# Patient Record
Sex: Female | Born: 1955 | Race: Black or African American | Hispanic: No | Marital: Single | Smoking: Current some day smoker
Health system: Southern US, Community
[De-identification: ages and names within clinical notes are randomized; demographics above are authoritative.]

## PROBLEM LIST (undated history)

## (undated) DIAGNOSIS — F319 Bipolar disorder, unspecified: Secondary | ICD-10-CM

## (undated) DIAGNOSIS — Z720 Tobacco use: Secondary | ICD-10-CM

## (undated) DIAGNOSIS — I499 Cardiac arrhythmia, unspecified: Secondary | ICD-10-CM

## (undated) DIAGNOSIS — E669 Obesity, unspecified: Secondary | ICD-10-CM

## (undated) DIAGNOSIS — I4891 Unspecified atrial fibrillation: Secondary | ICD-10-CM

## (undated) DIAGNOSIS — Z7901 Long term (current) use of anticoagulants: Secondary | ICD-10-CM

## (undated) DIAGNOSIS — I10 Essential (primary) hypertension: Secondary | ICD-10-CM

## (undated) DIAGNOSIS — I340 Nonrheumatic mitral (valve) insufficiency: Secondary | ICD-10-CM

## (undated) DIAGNOSIS — I509 Heart failure, unspecified: Secondary | ICD-10-CM

## (undated) HISTORY — DX: Nonrheumatic mitral (valve) insufficiency: I34.0

## (undated) HISTORY — DX: Long term (current) use of anticoagulants: Z79.01

## (undated) HISTORY — DX: Essential (primary) hypertension: I10

## (undated) HISTORY — DX: Heart failure, unspecified: I50.9

## (undated) HISTORY — DX: Obesity, unspecified: E66.9

## (undated) HISTORY — DX: Tobacco use: Z72.0

## (undated) HISTORY — DX: Bipolar disorder, unspecified: F31.9

## (undated) HISTORY — DX: Unspecified atrial fibrillation: I48.91

---

## 2002-09-03 ENCOUNTER — Emergency Department (HOSPITAL_COMMUNITY): Admission: EM | Admit: 2002-09-03 | Discharge: 2002-09-03 | Payer: Self-pay | Admitting: Emergency Medicine

## 2002-09-05 ENCOUNTER — Inpatient Hospital Stay (HOSPITAL_COMMUNITY): Admission: EM | Admit: 2002-09-05 | Discharge: 2002-09-14 | Payer: Self-pay | Admitting: Psychiatry

## 2002-10-27 ENCOUNTER — Encounter: Admission: RE | Admit: 2002-10-27 | Discharge: 2002-10-27 | Payer: Self-pay | Admitting: Psychiatry

## 2002-11-11 ENCOUNTER — Encounter: Payer: Self-pay | Admitting: Obstetrics and Gynecology

## 2002-11-11 ENCOUNTER — Encounter: Admission: RE | Admit: 2002-11-11 | Discharge: 2002-11-11 | Payer: Self-pay | Admitting: Obstetrics and Gynecology

## 2002-11-25 ENCOUNTER — Encounter: Payer: Self-pay | Admitting: Obstetrics and Gynecology

## 2002-11-25 ENCOUNTER — Ambulatory Visit (HOSPITAL_COMMUNITY): Admission: RE | Admit: 2002-11-25 | Discharge: 2002-11-25 | Payer: Self-pay | Admitting: Obstetrics and Gynecology

## 2003-12-22 ENCOUNTER — Ambulatory Visit (HOSPITAL_COMMUNITY): Payer: Self-pay | Admitting: Psychiatry

## 2004-01-26 ENCOUNTER — Ambulatory Visit (HOSPITAL_COMMUNITY): Payer: Self-pay | Admitting: Psychiatry

## 2004-03-29 ENCOUNTER — Ambulatory Visit (HOSPITAL_COMMUNITY): Payer: Self-pay | Admitting: Psychiatry

## 2004-05-31 ENCOUNTER — Ambulatory Visit (HOSPITAL_COMMUNITY): Payer: Self-pay | Admitting: Psychiatry

## 2004-08-12 ENCOUNTER — Ambulatory Visit (HOSPITAL_COMMUNITY): Payer: Self-pay | Admitting: Psychiatry

## 2004-10-16 ENCOUNTER — Ambulatory Visit (HOSPITAL_COMMUNITY): Payer: Self-pay | Admitting: Psychiatry

## 2004-12-13 ENCOUNTER — Ambulatory Visit (HOSPITAL_COMMUNITY): Payer: Self-pay | Admitting: Psychiatry

## 2005-02-10 ENCOUNTER — Ambulatory Visit (HOSPITAL_COMMUNITY): Payer: Self-pay | Admitting: Psychiatry

## 2005-02-10 ENCOUNTER — Ambulatory Visit: Payer: Self-pay | Admitting: Psychiatry

## 2005-02-17 ENCOUNTER — Encounter: Admission: RE | Admit: 2005-02-17 | Discharge: 2005-02-17 | Payer: Self-pay | Admitting: Family Medicine

## 2005-03-05 ENCOUNTER — Ambulatory Visit (HOSPITAL_BASED_OUTPATIENT_CLINIC_OR_DEPARTMENT_OTHER): Admission: RE | Admit: 2005-03-05 | Discharge: 2005-03-05 | Payer: Self-pay | Admitting: Family Medicine

## 2005-03-09 ENCOUNTER — Ambulatory Visit: Payer: Self-pay | Admitting: Internal Medicine

## 2005-04-02 ENCOUNTER — Ambulatory Visit (HOSPITAL_BASED_OUTPATIENT_CLINIC_OR_DEPARTMENT_OTHER): Admission: RE | Admit: 2005-04-02 | Discharge: 2005-04-02 | Payer: Self-pay | Admitting: Family Medicine

## 2005-05-02 ENCOUNTER — Ambulatory Visit (HOSPITAL_COMMUNITY): Payer: Self-pay | Admitting: Psychiatry

## 2005-06-09 ENCOUNTER — Ambulatory Visit (HOSPITAL_COMMUNITY): Payer: Self-pay | Admitting: Psychiatry

## 2005-06-23 ENCOUNTER — Ambulatory Visit (HOSPITAL_COMMUNITY): Payer: Self-pay | Admitting: Psychiatry

## 2005-08-18 ENCOUNTER — Ambulatory Visit (HOSPITAL_COMMUNITY): Payer: Self-pay | Admitting: Psychiatry

## 2005-10-20 ENCOUNTER — Ambulatory Visit (HOSPITAL_COMMUNITY): Payer: Self-pay | Admitting: Psychiatry

## 2005-12-22 ENCOUNTER — Ambulatory Visit (HOSPITAL_COMMUNITY): Payer: Self-pay | Admitting: Psychiatry

## 2006-02-04 ENCOUNTER — Encounter: Admission: RE | Admit: 2006-02-04 | Discharge: 2006-02-04 | Payer: Self-pay | Admitting: Family Medicine

## 2006-02-16 ENCOUNTER — Ambulatory Visit (HOSPITAL_COMMUNITY): Payer: Self-pay | Admitting: Psychiatry

## 2006-03-13 ENCOUNTER — Encounter: Admission: RE | Admit: 2006-03-13 | Discharge: 2006-03-13 | Payer: Self-pay | Admitting: Family Medicine

## 2006-04-20 ENCOUNTER — Ambulatory Visit (HOSPITAL_COMMUNITY): Payer: Self-pay | Admitting: Psychiatry

## 2006-06-15 ENCOUNTER — Ambulatory Visit (HOSPITAL_COMMUNITY): Payer: Self-pay | Admitting: Psychiatry

## 2006-08-27 ENCOUNTER — Ambulatory Visit (HOSPITAL_COMMUNITY): Payer: Self-pay | Admitting: Psychiatry

## 2006-11-16 ENCOUNTER — Ambulatory Visit (HOSPITAL_COMMUNITY): Payer: Self-pay | Admitting: Psychiatry

## 2007-01-11 ENCOUNTER — Ambulatory Visit (HOSPITAL_COMMUNITY): Payer: Self-pay | Admitting: Psychiatry

## 2007-02-08 ENCOUNTER — Encounter: Admission: RE | Admit: 2007-02-08 | Discharge: 2007-02-08 | Payer: Self-pay | Admitting: Family Medicine

## 2007-03-22 ENCOUNTER — Ambulatory Visit (HOSPITAL_COMMUNITY): Payer: Self-pay | Admitting: Psychiatry

## 2007-07-13 ENCOUNTER — Ambulatory Visit (HOSPITAL_COMMUNITY): Payer: Self-pay | Admitting: Psychiatry

## 2007-09-08 DIAGNOSIS — I4891 Unspecified atrial fibrillation: Secondary | ICD-10-CM

## 2007-09-08 HISTORY — DX: Unspecified atrial fibrillation: I48.91

## 2007-09-13 ENCOUNTER — Ambulatory Visit: Payer: Self-pay | Admitting: Internal Medicine

## 2007-09-13 ENCOUNTER — Encounter: Payer: Self-pay | Admitting: Emergency Medicine

## 2007-09-14 ENCOUNTER — Encounter (INDEPENDENT_AMBULATORY_CARE_PROVIDER_SITE_OTHER): Payer: Self-pay | Admitting: Internal Medicine

## 2007-09-14 ENCOUNTER — Inpatient Hospital Stay (HOSPITAL_COMMUNITY): Admission: EM | Admit: 2007-09-14 | Discharge: 2007-09-20 | Payer: Self-pay | Admitting: Internal Medicine

## 2007-09-14 ENCOUNTER — Ambulatory Visit: Payer: Self-pay | Admitting: Endocrinology

## 2007-09-22 ENCOUNTER — Ambulatory Visit: Payer: Self-pay | Admitting: Cardiology

## 2007-09-27 ENCOUNTER — Ambulatory Visit: Payer: Self-pay | Admitting: Cardiovascular Disease

## 2007-10-04 ENCOUNTER — Ambulatory Visit: Payer: Self-pay | Admitting: Cardiovascular Disease

## 2007-10-11 ENCOUNTER — Ambulatory Visit: Payer: Self-pay | Admitting: Cardiology

## 2007-10-18 ENCOUNTER — Ambulatory Visit: Payer: Self-pay | Admitting: Cardiology

## 2007-10-28 ENCOUNTER — Ambulatory Visit: Payer: Self-pay | Admitting: Internal Medicine

## 2007-11-25 ENCOUNTER — Ambulatory Visit: Payer: Self-pay | Admitting: Cardiology

## 2007-12-15 ENCOUNTER — Ambulatory Visit (HOSPITAL_COMMUNITY): Payer: Self-pay | Admitting: Psychiatry

## 2007-12-20 ENCOUNTER — Encounter: Payer: Self-pay | Admitting: Cardiovascular Disease

## 2007-12-20 ENCOUNTER — Ambulatory Visit: Payer: Self-pay | Admitting: Cardiology

## 2007-12-20 ENCOUNTER — Ambulatory Visit: Payer: Self-pay | Admitting: Cardiovascular Disease

## 2007-12-20 ENCOUNTER — Ambulatory Visit: Payer: Self-pay

## 2007-12-20 LAB — CONVERTED CEMR LAB
ALT: 15 units/L (ref 0–35)
AST: 20 units/L (ref 0–37)
Albumin: 3.2 g/dL — ABNORMAL LOW (ref 3.5–5.2)
Alkaline Phosphatase: 52 units/L (ref 39–117)
Bilirubin, Direct: 0.1 mg/dL (ref 0.0–0.3)
TSH: 2.08 microintl units/mL (ref 0.35–5.50)
Total Bilirubin: 0.6 mg/dL (ref 0.3–1.2)
Total Protein: 6.8 g/dL (ref 6.0–8.3)

## 2008-01-03 ENCOUNTER — Ambulatory Visit: Admission: RE | Admit: 2008-01-03 | Discharge: 2008-01-03 | Payer: Self-pay | Admitting: Cardiovascular Disease

## 2008-01-03 ENCOUNTER — Ambulatory Visit: Payer: Self-pay

## 2008-01-03 ENCOUNTER — Ambulatory Visit: Payer: Self-pay | Admitting: Cardiology

## 2008-01-03 ENCOUNTER — Encounter: Payer: Self-pay | Admitting: Internal Medicine

## 2008-01-10 ENCOUNTER — Ambulatory Visit: Payer: Self-pay | Admitting: Cardiovascular Disease

## 2008-01-14 ENCOUNTER — Ambulatory Visit: Payer: Self-pay | Admitting: Internal Medicine

## 2008-01-14 DIAGNOSIS — R0602 Shortness of breath: Secondary | ICD-10-CM | POA: Insufficient documentation

## 2008-01-14 DIAGNOSIS — I4891 Unspecified atrial fibrillation: Secondary | ICD-10-CM

## 2008-01-14 DIAGNOSIS — I10 Essential (primary) hypertension: Secondary | ICD-10-CM

## 2008-01-14 DIAGNOSIS — F17200 Nicotine dependence, unspecified, uncomplicated: Secondary | ICD-10-CM | POA: Insufficient documentation

## 2008-01-31 ENCOUNTER — Ambulatory Visit: Payer: Self-pay | Admitting: Internal Medicine

## 2008-02-17 ENCOUNTER — Ambulatory Visit: Payer: Self-pay | Admitting: Internal Medicine

## 2008-02-28 ENCOUNTER — Ambulatory Visit: Payer: Self-pay | Admitting: Cardiology

## 2008-03-17 ENCOUNTER — Encounter: Admission: RE | Admit: 2008-03-17 | Discharge: 2008-03-17 | Payer: Self-pay | Admitting: Family Medicine

## 2008-03-27 ENCOUNTER — Ambulatory Visit: Payer: Self-pay | Admitting: Cardiology

## 2008-04-18 ENCOUNTER — Ambulatory Visit: Payer: Self-pay | Admitting: Internal Medicine

## 2008-04-19 ENCOUNTER — Ambulatory Visit (HOSPITAL_COMMUNITY): Payer: Self-pay | Admitting: Psychiatry

## 2008-04-20 ENCOUNTER — Ambulatory Visit: Payer: Self-pay | Admitting: Cardiovascular Disease

## 2008-04-27 ENCOUNTER — Ambulatory Visit: Payer: Self-pay | Admitting: Internal Medicine

## 2008-05-11 ENCOUNTER — Ambulatory Visit: Payer: Self-pay | Admitting: Cardiovascular Disease

## 2008-06-01 ENCOUNTER — Ambulatory Visit: Payer: Self-pay | Admitting: Cardiology

## 2008-06-17 ENCOUNTER — Emergency Department (HOSPITAL_COMMUNITY): Admission: EM | Admit: 2008-06-17 | Discharge: 2008-06-17 | Payer: Self-pay | Admitting: Emergency Medicine

## 2008-06-29 ENCOUNTER — Ambulatory Visit: Payer: Self-pay | Admitting: Cardiovascular Disease

## 2008-07-06 ENCOUNTER — Emergency Department (HOSPITAL_COMMUNITY): Admission: EM | Admit: 2008-07-06 | Discharge: 2008-07-06 | Payer: Self-pay | Admitting: Emergency Medicine

## 2008-07-18 ENCOUNTER — Ambulatory Visit: Payer: Self-pay | Admitting: Internal Medicine

## 2008-08-08 ENCOUNTER — Encounter: Payer: Self-pay | Admitting: *Deleted

## 2008-08-09 ENCOUNTER — Ambulatory Visit (HOSPITAL_COMMUNITY): Payer: Self-pay | Admitting: Psychiatry

## 2008-08-10 ENCOUNTER — Encounter (INDEPENDENT_AMBULATORY_CARE_PROVIDER_SITE_OTHER): Payer: Self-pay | Admitting: *Deleted

## 2008-08-11 ENCOUNTER — Ambulatory Visit: Payer: Self-pay | Admitting: Internal Medicine

## 2008-08-11 LAB — CONVERTED CEMR LAB
POC INR: 3.1
Protime: 21.1

## 2008-09-08 ENCOUNTER — Ambulatory Visit: Payer: Self-pay | Admitting: Cardiology

## 2008-09-08 LAB — CONVERTED CEMR LAB
POC INR: 1.8
Prothrombin Time: 16.7 s

## 2008-09-13 ENCOUNTER — Ambulatory Visit (HOSPITAL_COMMUNITY): Payer: Self-pay | Admitting: Psychiatry

## 2008-09-13 ENCOUNTER — Encounter: Payer: Self-pay | Admitting: *Deleted

## 2008-09-22 ENCOUNTER — Ambulatory Visit: Payer: Self-pay | Admitting: Cardiology

## 2008-09-22 LAB — CONVERTED CEMR LAB
POC INR: 2.4
Prothrombin Time: 18.7 s

## 2008-10-03 DIAGNOSIS — I420 Dilated cardiomyopathy: Secondary | ICD-10-CM

## 2008-10-03 DIAGNOSIS — F411 Generalized anxiety disorder: Secondary | ICD-10-CM | POA: Insufficient documentation

## 2008-10-03 DIAGNOSIS — E669 Obesity, unspecified: Secondary | ICD-10-CM

## 2008-10-03 DIAGNOSIS — F319 Bipolar disorder, unspecified: Secondary | ICD-10-CM | POA: Insufficient documentation

## 2008-10-04 ENCOUNTER — Ambulatory Visit: Payer: Self-pay | Admitting: Cardiovascular Disease

## 2008-10-04 ENCOUNTER — Telehealth (INDEPENDENT_AMBULATORY_CARE_PROVIDER_SITE_OTHER): Payer: Self-pay | Admitting: *Deleted

## 2008-10-20 ENCOUNTER — Ambulatory Visit: Payer: Self-pay | Admitting: Cardiology

## 2008-10-20 LAB — CONVERTED CEMR LAB: POC INR: 2

## 2008-11-10 ENCOUNTER — Ambulatory Visit: Payer: Self-pay | Admitting: Internal Medicine

## 2008-11-10 LAB — CONVERTED CEMR LAB: POC INR: 2.5

## 2008-11-13 ENCOUNTER — Emergency Department (HOSPITAL_COMMUNITY): Admission: EM | Admit: 2008-11-13 | Discharge: 2008-11-13 | Payer: Self-pay | Admitting: Emergency Medicine

## 2008-12-01 ENCOUNTER — Ambulatory Visit: Payer: Self-pay | Admitting: Internal Medicine

## 2008-12-01 LAB — CONVERTED CEMR LAB: POC INR: 1.6

## 2008-12-12 ENCOUNTER — Ambulatory Visit: Payer: Self-pay

## 2008-12-12 ENCOUNTER — Ambulatory Visit: Payer: Self-pay | Admitting: Cardiology

## 2008-12-12 ENCOUNTER — Ambulatory Visit (HOSPITAL_COMMUNITY): Admission: RE | Admit: 2008-12-12 | Discharge: 2008-12-12 | Payer: Self-pay | Admitting: Cardiology

## 2008-12-12 ENCOUNTER — Encounter: Payer: Self-pay | Admitting: Cardiovascular Disease

## 2008-12-13 ENCOUNTER — Ambulatory Visit (HOSPITAL_COMMUNITY): Payer: Self-pay | Admitting: Psychiatry

## 2008-12-22 ENCOUNTER — Ambulatory Visit: Payer: Self-pay | Admitting: Cardiology

## 2008-12-22 LAB — CONVERTED CEMR LAB: POC INR: 1.5

## 2009-01-05 ENCOUNTER — Ambulatory Visit: Payer: Self-pay | Admitting: Cardiovascular Disease

## 2009-01-05 LAB — CONVERTED CEMR LAB: POC INR: 1.8

## 2009-01-19 ENCOUNTER — Ambulatory Visit: Payer: Self-pay | Admitting: Internal Medicine

## 2009-01-19 LAB — CONVERTED CEMR LAB: POC INR: 2.2

## 2009-02-09 ENCOUNTER — Ambulatory Visit: Payer: Self-pay | Admitting: Cardiology

## 2009-02-09 LAB — CONVERTED CEMR LAB: POC INR: 2.5

## 2009-03-10 HISTORY — PX: TRANSESOPHAGEAL ECHOCARDIOGRAM: SHX273

## 2009-03-14 ENCOUNTER — Ambulatory Visit (HOSPITAL_COMMUNITY): Payer: Self-pay | Admitting: Psychiatry

## 2009-03-16 ENCOUNTER — Ambulatory Visit: Payer: Self-pay | Admitting: Cardiovascular Disease

## 2009-03-16 LAB — CONVERTED CEMR LAB: POC INR: 1.9

## 2009-04-06 ENCOUNTER — Ambulatory Visit: Payer: Self-pay | Admitting: Internal Medicine

## 2009-04-06 LAB — CONVERTED CEMR LAB: POC INR: 5.7

## 2009-04-11 ENCOUNTER — Ambulatory Visit (HOSPITAL_COMMUNITY): Payer: Self-pay | Admitting: Psychiatry

## 2009-04-13 ENCOUNTER — Ambulatory Visit: Payer: Self-pay | Admitting: Internal Medicine

## 2009-04-13 LAB — CONVERTED CEMR LAB: POC INR: 4.9

## 2009-04-27 ENCOUNTER — Ambulatory Visit: Payer: Self-pay | Admitting: Cardiology

## 2009-04-27 ENCOUNTER — Encounter (INDEPENDENT_AMBULATORY_CARE_PROVIDER_SITE_OTHER): Payer: Self-pay | Admitting: Cardiology

## 2009-04-27 LAB — CONVERTED CEMR LAB: POC INR: 3.8

## 2009-05-15 ENCOUNTER — Ambulatory Visit: Payer: Self-pay | Admitting: Cardiovascular Disease

## 2009-05-15 LAB — CONVERTED CEMR LAB: POC INR: 1.8

## 2009-06-01 ENCOUNTER — Ambulatory Visit: Payer: Self-pay | Admitting: Cardiology

## 2009-06-01 LAB — CONVERTED CEMR LAB: POC INR: 2.6

## 2009-06-22 ENCOUNTER — Ambulatory Visit: Payer: Self-pay | Admitting: Internal Medicine

## 2009-06-22 LAB — CONVERTED CEMR LAB: POC INR: 2.5

## 2009-07-11 ENCOUNTER — Ambulatory Visit (HOSPITAL_COMMUNITY): Payer: Self-pay | Admitting: Psychiatry

## 2009-07-13 ENCOUNTER — Ambulatory Visit: Payer: Self-pay | Admitting: Cardiology

## 2009-07-13 LAB — CONVERTED CEMR LAB: POC INR: 1.7

## 2009-07-27 ENCOUNTER — Ambulatory Visit: Payer: Self-pay | Admitting: Cardiology

## 2009-07-27 LAB — CONVERTED CEMR LAB: POC INR: 1.8

## 2009-08-10 ENCOUNTER — Ambulatory Visit: Payer: Self-pay | Admitting: Cardiovascular Disease

## 2009-08-10 LAB — CONVERTED CEMR LAB: POC INR: 2.6

## 2009-08-31 ENCOUNTER — Ambulatory Visit: Payer: Self-pay | Admitting: Cardiology

## 2009-08-31 LAB — CONVERTED CEMR LAB: POC INR: 5

## 2009-09-07 ENCOUNTER — Ambulatory Visit: Payer: Self-pay | Admitting: Internal Medicine

## 2009-09-07 LAB — CONVERTED CEMR LAB: POC INR: 2.4

## 2009-09-20 ENCOUNTER — Ambulatory Visit: Payer: Self-pay | Admitting: Cardiovascular Disease

## 2009-09-20 ENCOUNTER — Inpatient Hospital Stay (HOSPITAL_COMMUNITY): Admission: EM | Admit: 2009-09-20 | Discharge: 2009-10-04 | Payer: Self-pay | Admitting: Emergency Medicine

## 2009-09-21 ENCOUNTER — Encounter: Payer: Self-pay | Admitting: Cardiology

## 2009-09-24 ENCOUNTER — Encounter: Payer: Self-pay | Admitting: Cardiology

## 2009-09-25 ENCOUNTER — Encounter: Payer: Self-pay | Admitting: Cardiovascular Disease

## 2009-10-15 ENCOUNTER — Encounter: Payer: Self-pay | Admitting: Cardiovascular Disease

## 2009-10-15 LAB — CONVERTED CEMR LAB
POC INR: 1.9
Prothrombin Time: 22.4 s

## 2009-10-17 ENCOUNTER — Encounter: Payer: Self-pay | Admitting: Cardiovascular Disease

## 2009-10-22 ENCOUNTER — Encounter: Payer: Self-pay | Admitting: Cardiology

## 2009-10-22 ENCOUNTER — Encounter: Payer: Self-pay | Admitting: Cardiovascular Disease

## 2009-10-22 LAB — CONVERTED CEMR LAB
POC INR: 1.5
Prothrombin Time: 17.8 s

## 2009-10-30 ENCOUNTER — Ambulatory Visit: Payer: Self-pay | Admitting: Cardiovascular Disease

## 2009-10-30 ENCOUNTER — Encounter: Payer: Self-pay | Admitting: Cardiovascular Disease

## 2009-10-30 DIAGNOSIS — R259 Unspecified abnormal involuntary movements: Secondary | ICD-10-CM | POA: Insufficient documentation

## 2009-10-30 LAB — CONVERTED CEMR LAB: POC INR: 1.7

## 2009-11-01 ENCOUNTER — Encounter: Payer: Self-pay | Admitting: Cardiovascular Disease

## 2009-11-15 ENCOUNTER — Ambulatory Visit: Payer: Self-pay | Admitting: Internal Medicine

## 2009-11-15 LAB — CONVERTED CEMR LAB: POC INR: 1.3

## 2009-11-26 ENCOUNTER — Ambulatory Visit: Payer: Self-pay | Admitting: Cardiovascular Disease

## 2009-12-03 ENCOUNTER — Encounter: Payer: Self-pay | Admitting: Cardiovascular Disease

## 2009-12-10 ENCOUNTER — Ambulatory Visit: Payer: Self-pay | Admitting: Internal Medicine

## 2009-12-31 ENCOUNTER — Ambulatory Visit: Payer: Self-pay | Admitting: Internal Medicine

## 2009-12-31 LAB — CONVERTED CEMR LAB: POC INR: 2.2

## 2010-01-09 ENCOUNTER — Telehealth: Payer: Self-pay | Admitting: Cardiovascular Disease

## 2010-01-17 ENCOUNTER — Ambulatory Visit: Payer: Self-pay

## 2010-01-17 ENCOUNTER — Ambulatory Visit: Payer: Self-pay | Admitting: Cardiovascular Disease

## 2010-01-18 ENCOUNTER — Encounter: Payer: Self-pay | Admitting: Cardiovascular Disease

## 2010-01-21 ENCOUNTER — Telehealth: Payer: Self-pay | Admitting: Cardiovascular Disease

## 2010-01-22 LAB — CONVERTED CEMR LAB
CO2: 19 meq/L (ref 19–32)
Calcium: 9.3 mg/dL (ref 8.4–10.5)
GFR calc non Af Amer: 81.06 mL/min (ref >60)
Sodium: 137 meq/L (ref 135–145)

## 2010-01-28 ENCOUNTER — Ambulatory Visit: Payer: Self-pay | Admitting: Cardiovascular Disease

## 2010-01-28 LAB — CONVERTED CEMR LAB: POC INR: 2.6

## 2010-02-18 ENCOUNTER — Telehealth: Payer: Self-pay | Admitting: Cardiovascular Disease

## 2010-02-18 ENCOUNTER — Ambulatory Visit: Payer: Self-pay | Admitting: Cardiovascular Disease

## 2010-02-18 ENCOUNTER — Encounter (INDEPENDENT_AMBULATORY_CARE_PROVIDER_SITE_OTHER): Payer: Self-pay | Admitting: *Deleted

## 2010-02-19 ENCOUNTER — Encounter: Payer: Self-pay | Admitting: Internal Medicine

## 2010-02-19 ENCOUNTER — Encounter: Payer: Self-pay | Admitting: Cardiovascular Disease

## 2010-02-19 LAB — CONVERTED CEMR LAB
INR: 1.4 — ABNORMAL HIGH (ref 0.8–1.0)
POC INR: 1.4

## 2010-02-22 ENCOUNTER — Encounter: Payer: Self-pay | Admitting: Cardiovascular Disease

## 2010-02-28 ENCOUNTER — Ambulatory Visit: Payer: Self-pay | Admitting: Cardiovascular Disease

## 2010-02-28 ENCOUNTER — Encounter: Payer: Self-pay | Admitting: Cardiology

## 2010-02-28 LAB — CONVERTED CEMR LAB
POC INR: 2.1
Prothrombin Time: 22.1 s

## 2010-03-06 LAB — CONVERTED CEMR LAB
INR: 2.1 — ABNORMAL HIGH (ref 0.8–1.0)
Prothrombin Time: 22.1 s — ABNORMAL HIGH (ref 9.7–11.8)

## 2010-03-22 ENCOUNTER — Ambulatory Visit
Admission: RE | Admit: 2010-03-22 | Discharge: 2010-03-22 | Payer: Self-pay | Source: Home / Self Care | Attending: Cardiology | Admitting: Cardiology

## 2010-03-22 ENCOUNTER — Encounter: Payer: Self-pay | Admitting: Cardiovascular Disease

## 2010-03-27 ENCOUNTER — Ambulatory Visit (HOSPITAL_COMMUNITY)
Admission: RE | Admit: 2010-03-27 | Discharge: 2010-03-27 | Payer: Self-pay | Source: Home / Self Care | Attending: Psychiatry | Admitting: Psychiatry

## 2010-03-31 ENCOUNTER — Encounter: Payer: Self-pay | Admitting: Family Medicine

## 2010-04-04 ENCOUNTER — Ambulatory Visit: Admit: 2010-04-04 | Payer: Self-pay

## 2010-04-09 NOTE — Medication Information (Signed)
Summary: rov/eac  Anticoagulant Therapy  Managed by: Weston Brass, PharmD Referring MD: Charlton Haws MD PCP: Dr. Charlton Haws Supervising MD: Eden Emms MD, Theron Arista Indication 1: Atrial Fibrillation (ICD-427.31) Lab Used: [facility] Site: [facility] INR POC 2.6 INR RANGE 2 - 3  Dietary changes: no    Health status changes: no    Bleeding/hemorrhagic complications: no    Recent/future hospitalizations: no    Any changes in medication regimen? no    Recent/future dental: no  Any missed doses?: no       Is patient compliant with meds? yes       Allergies: No Known Drug Allergies  Anticoagulation Management History:      The patient is taking warfarin and comes in today for a routine follow up visit.  Negative risk factors for bleeding include an age less than 17 years old.  The bleeding index is 'low risk'.  Positive CHADS2 values include History of CHF and History of HTN.  Negative CHADS2 values include Age > 73 years old.  The start date was 09/15/2007.  Anticoagulation responsible provider: Eden Emms MD, Theron Arista.  INR POC: 2.6.  Cuvette Lot#: [id number].  Exp: 10/2010.    Anticoagulation Management Assessment/Plan:      The patient's current anticoagulation dose is Coumadin 2.5 mg tabs: Take as directed by coumadin clinic..  The target INR is 2 - 3.  The next INR is due 08/31/2009.  Anticoagulation instructions were given to patient.  Results were reviewed/authorized by Weston Brass, PharmD.  She was notified by Weston Brass PharmD.         Prior Anticoagulation Instructions: INR 1.8  Take 1 extra tablet today.  Then return to normal dosing schedule of 2 tablets on Tuesday and Thursday and 1 tablet all other days.  Return to clinic in 2 weeks.    Current Anticoagulation Instructions: INR 2.6  Continue same dose of 1 tablet every day except 2 tablets on Tuesday and Thursday

## 2010-04-09 NOTE — Letter (Signed)
Summary: Handout Printed  Printed Handout:  - Coumadin Instructions-w/out Meds 

## 2010-04-09 NOTE — Medication Information (Signed)
Summary: rov/tm  Anticoagulant Therapy  Managed by: Weston Brass, PharmD Referring MD: Charlton Haws MD PCP: Dr. Charlton Haws Supervising MD: Eden Emms MD, Theron Arista Indication 1: Atrial Fibrillation (ICD-427.31) Lab Used: [facility] Site: [facility] INR POC 1.9 INR RANGE 2 - 3  Dietary changes: no    Health status changes: no    Bleeding/hemorrhagic complications: no    Recent/future hospitalizations: no    Any changes in medication regimen? no    Recent/future dental: no  Any missed doses?: no       Is patient compliant with meds? yes       Allergies: No Known Drug Allergies  Anticoagulation Management History:      The patient is taking warfarin and comes in today for a routine follow up visit.  Negative risk factors for bleeding include an age less than 49 years old.  The bleeding index is 'low risk'.  Positive CHADS2 values include History of CHF and History of HTN.  Negative CHADS2 values include Age > 15 years old.  The start date was 09/15/2007.  Anticoagulation responsible provider: Eden Emms MD, Theron Arista.  INR POC: 1.9.  Cuvette Lot#: [id number].  Exp: 04/2010.    Anticoagulation Management Assessment/Plan:      The patient's current anticoagulation dose is Coumadin 2.5 mg tabs: Take as directed by coumadin clinic., Warfarin sodium 2.5 mg tabs: Marland Kitchen  The target INR is 2 - 3.  The next INR is due 04/06/2009.  Anticoagulation instructions were given to patient.  Results were reviewed/authorized by Weston Brass, PharmD.  She was notified by Weston Brass PharmD.         Prior Anticoagulation Instructions: INR 2.5 Continue 2 tablets everyday except 1 tablet on Mondays, Wednesdays and Fridays. Recheck in 4 weeks.   Current Anticoagulation Instructions: INR 1.9  Take 1 extra tablet (green pill) today when you get home then continue the same dose of 2 tablets every day except 1 tablet on Monday, Wednesday and Friday

## 2010-04-09 NOTE — Progress Notes (Signed)
Summary: calling about pt pills  Phone Note Other Incoming   Caller: Marylou Mccoy [phone] Summary of Call: Nurse calling from [facility] department Initial call taken by: Judie Grieve,  January 21, 2010 3:15 PM  Follow-up for Phone Call        Left message to call back Deliah Goody, RN  January 21, 2010 4:28 PM  spoke with carolyn questions answered Deliah Goody, RN  January 21, 2010 4:48 PM

## 2010-04-09 NOTE — Medication Information (Signed)
Summary: rov/mw  Anticoagulant Therapy  Managed by: Weston Brass, PharmD Referring MD: Charlton Haws MD PCP: Dr. Charlton Haws Supervising MD: Myrtis Ser MD, Tinnie Gens Indication 1: Atrial Fibrillation (ICD-427.31) Lab Used: [facility] [place] Site: [address] INR POC 2.8 INR RANGE 2 - 3  Dietary changes: no    Health status changes: no    Bleeding/hemorrhagic complications: no    Recent/future hospitalizations: no    Any changes in medication regimen? no    Recent/future dental: no  Any missed doses?: no       Is patient compliant with meds? yes       Allergies: No Known Drug Allergies  Anticoagulation Management History:      The patient is taking warfarin and comes in today for a routine follow up visit.  Negative risk factors for bleeding include an age less than 76 years old.  The bleeding index is 'low risk'.  Positive CHADS2 values include History of CHF and History of HTN.  Negative CHADS2 values include Age > 72 years old.  The start date was 09/15/2007.  Anticoagulation responsible provider: Myrtis Ser MD, Tinnie Gens.  INR POC: 2.8.  Cuvette Lot#: [id number].  Exp: 01/2011.    Anticoagulation Management Assessment/Plan:      The patient's current anticoagulation dose is Warfarin sodium 5 mg tabs: Take as directed by Coumadin clinic.  The target INR is 2 - 3.  The next INR is due 12/31/2009.  Anticoagulation instructions were given to patient.  Results were reviewed/authorized by Weston Brass, PharmD.  She was notified by Weston Brass PharmD.         Prior Anticoagulation Instructions: INR 3.8  Skip your dose tomorrow. Change your friday dose to a half of a tablet instead of 1. See you in 2 weeks.  Current Anticoagulation Instructions: INR 2.8  Your number looks great!  Continue 1 tablet every day except 1/2 tablet on Monday, Wednesday and Friday.  Recheck INR in 3 weeks.

## 2010-04-09 NOTE — Medication Information (Signed)
Summary: rov/tm  Anticoagulant Therapy  Managed by: Reina Fuse, PharmD Referring MD: Charlton Haws MD PCP: Dr. Charlton Haws Supervising MD: Eden Emms MD, Theron Arista Indication 1: Atrial Fibrillation (ICD-427.31) Lab Used: [facility] [place] Site: [address] INR POC 1.7 INR RANGE 2 - 3  Dietary changes: yes       Details: Ate 2 servings of greens last week.   Health status changes: no    Bleeding/hemorrhagic complications: no    Recent/future hospitalizations: no    Any changes in medication regimen? no    Recent/future dental: no  Any missed doses?: no       Is patient compliant with meds? yes       Allergies: No Known Drug Allergies  Anticoagulation Management History:      The patient is taking warfarin and comes in today for a routine follow up visit.  Negative risk factors for bleeding include an age less than 55 years old.  The bleeding index is 'low risk'.  Positive CHADS2 values include History of CHF and History of HTN.  Negative CHADS2 values include Age > 74 years old.  The start date was 09/15/2007.  Anticoagulation responsible provider: Eden Emms MD, Theron Arista.  INR POC: 1.7.  Cuvette Lot#: [id number].  Exp: 11/2010.    Anticoagulation Management Assessment/Plan:      The patient's current anticoagulation dose is Coumadin 2.5 mg tabs: Take as directed by coumadin clinic..  The target INR is 2 - 3.  The next INR is due 11/13/2009.  Anticoagulation instructions were given to patient.  Results were reviewed/authorized by Reina Fuse, PharmD.  She was notified by Reina Fuse PharmD.         Prior Anticoagulation Instructions: INR 1.5 Today 5mg  then change dose to 2.5mg s daily except 5mg s on TT&Sat. Recheck in one week.   Current Anticoagulation Instructions: INR 1.7  Tomorrow, Wednesday, August 24th, take Coumadin 2 tabs (5 mg). Then, take Coumadin 2 tabs (5 mg) on Sun, Tues, Thur, Sat and Coumadin 1 tab (2.5 mg) on Mon, Wed, Fri. Return to clinic in 2 weeks.

## 2010-04-09 NOTE — Consult Note (Signed)
Summary: [facility]   [facility]   Imported By: Roderic Ovens 11/28/2009 14:01:41  _____________________________________________________________________  External Attachment:    Type:   Image     Comment:   External Document

## 2010-04-09 NOTE — Miscellaneous (Signed)
Summary: [facility] Orders   [facility] Orders   Imported By: Roderic Ovens 10/19/2009 14:29:50  _____________________________________________________________________  External Attachment:    Type:   Image     Comment:   External Document

## 2010-04-09 NOTE — Medication Information (Signed)
Summary: rov/ewj  Anticoagulant Therapy  Managed by: Elaina Pattee, PharmD Referring MD: Charlton Haws MD PCP: Dr. Charlton Haws Supervising MD: Tenny Craw MD, Gunnar Fusi Indication 1: Atrial Fibrillation (ICD-427.31) Lab Used: [facility] Site: [facility] INR POC 2.5 INR RANGE 2 - 3  Dietary changes: no    Health status changes: no    Bleeding/hemorrhagic complications: no    Recent/future hospitalizations: no    Any changes in medication regimen? no    Recent/future dental: no  Any missed doses?: no       Is patient compliant with meds? yes       Allergies: No Known Drug Allergies  Anticoagulation Management History:      The patient is taking warfarin and comes in today for a routine follow up visit.  Negative risk factors for bleeding include an age less than 5 years old.  The bleeding index is 'low risk'.  Positive CHADS2 values include History of CHF and History of HTN.  Negative CHADS2 values include Age > 2 years old.  The start date was 09/15/2007.  Anticoagulation responsible provider: Tenny Craw MD, Gunnar Fusi.  INR POC: 2.5.  Cuvette Lot#: [id number].  Exp: 07/2010.    Anticoagulation Management Assessment/Plan:      The patient's current anticoagulation dose is Coumadin 2.5 mg tabs: Take as directed by coumadin clinic..  The target INR is 2 - 3.  The next INR is due 07/13/2009.  Anticoagulation instructions were given to patient.  Results were reviewed/authorized by Elaina Pattee, PharmD.  She was notified by Elaina Pattee, PharmD.         Prior Anticoagulation Instructions: INR 2.6  Continue on same dosage 1 tablet daily except 2 tablets on Tuesdays and Thursdays.  Recheck in 3 weeks.    Current Anticoagulation Instructions: INR 2.5. Take 1 tablet daily except 2 tablets on Tues and Thurs.

## 2010-04-09 NOTE — Assessment & Plan Note (Signed)
Summary: ROV PER PT REQUEST LAST SEEN 09/2008   Referring Provider:  Dr. Charlton Haws Primary Provider:  Dr. Charlton Haws   History of Present Illness: Abigail Mahoney he seen today in followup for shortness of breath hypertension and paroxysmal atrial fibrillation.  She is also on anticoagulation with Coumadin.  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  She had successful cardioversion.  Last October her EF is about 40% by echo.  Need to repeat echo since she has maintained sinus rhythm to see if she has had an improvement.  She has some COPD.  We have stopped her amiodarone..  She has not had any recurrent palpitations shortness of breath or lower extremity edema.  Her bipolar disease appears well compensated.  Her blood pressure is under good control.  She will see the Coumadin clinic today.  She needs to stop smoking.  She says she smokes about 2cigs/day.  She prefers to do it cold [place] and with her bipolar disease I would agree that Chantix or Welbutrin would not be good.  I counseled her for less than 10 minutes about her smoking  Current Problems (verified): 1)  Cardiomyopathy  (ICD-425.4) 2)  Hypertension  (ICD-401.9) 3)  Hx of Atrial Fibrillation  (ICD-427.31) 4)  CHF  (ICD-428.0) 5)  Coumadin Therapy  (ICD-V58.61) 6)  Bipolar Disorder Unspecified  (ICD-296.80) 7)  Anxiety  (ICD-300.00) 8)  Tobacco Abuse  (ICD-305.1) 9)  Shortness of Breath  (ICD-786.05) 10)  Obesity  (ICD-278.00)  Current Medications (verified): 1)  Risperdal 2 Mg Tabs (Risperidone) .... Once Daily 2)  Divalproex Sodium 500 Mg Tbec (Divalproex Sodium) .... Two Times A Day 3)  Detrol La 4 Mg Xr24h-Cap (Tolterodine Tartrate) .... Once Daily 4)  Klor-Con M20 20 Meq Cr-Tabs (Potassium Chloride Crys Cr) .Marland Kitchen.. 1 Tab in The Morning and 1/2 Tab At Bedtime 5)  Lasix 40 Mg Tabs (Furosemide) .Marland Kitchen.. 1 Tab By Mouth Two Times A Day 6)  Metoprolol Succinate 50 Mg Xr24h-Tab (Metoprolol Succinate) .Marland Kitchen.. 1 1/2 Tab By Mouth Once  Daily 7)  Amiodarone Hcl 200 Mg Tabs (Amiodarone Hcl) .... Once Daily 8)  Coumadin 2.5 Mg Tabs (Warfarin Sodium) .... Take As Directed By Coumadin Clinic.  Allergies (verified): No Known Drug Allergies  Past History:  Past Medical History: Last updated: 10/03/2008 Current Problems:  CARDIOMYOPATHY (ICD-425.4) HYPERTENSION (ICD-401.9) CHF (ICD-428.0) COUMADIN THERAPY (ICD-V58.61) BIPOLAR DISORDER UNSPECIFIED (ICD-296.80) ANXIETY (ICD-300.00) TOBACCO ABUSE (ICD-305.1) SHORTNESS OF BREATH (ICD-786.05) OBESITY (ICD-278.00) Atrial Fibrillation (ICD-427.31) - July 2009 Cardiomyopathy/CHF - EF  30%-40%/global hypokinesis.  - July 2009 admit Admission rhythm is atrial fibrillation with rapid ventricular rate.       a.     Very difficult to achieve rate control secondary to tendency        to severe hypotension on medication.       b.     Failed Direct current cardioversion September 15, 2007.       c.     Started amiodarone September 16, 2007, with spontaneous conversion        of sinus rhythm after 72-hour load on September 18, 2007.   Past Surgical History: Last updated: 01/14/2008 September 16, 2007, transesophageal echocardiogram.  The EF is 30-  40% with global hypokinesis, mild mitral regurgitation.  No left atrial  appendage clot.   Family History: Last updated: 01/14/2008 Significant for hypertension and diabetes on both   mother's and father's side.   Social History: Last updated: 01/14/2008 lives alone disability She lives by herself.  She is currently unemployed, and   she smokes about a pack of cigarettes per day. She is vague about smoking hx. Started smokng at age 89. Currently smokes 2 cig/day but has smoked as miuch as 2packs a day. She started crying when she spoke about smoking.      Review of Systems       Denies fever, malais, weight loss, blurry vision, decreased visual acuity, cough, sputum, SOB, hemoptysis, pleuritic pain, palpitaitons, heartburn, abdominal pain, melena, lower  extremity edema, claudication, or rash.   Vital Signs:  Patient profile:   55 year old female Height:      64 inches Weight:      214 pounds BMI:     36.87 Pulse rate:   64 / minute Resp:     12 per minute BP sitting:   120 / 80  (left arm)  Vitals Entered By: Kem Parkinson (May 15, 2009 8:57 AM)  Physical Exam  General:  Affect appropriate Healthy:  appears stated age HEENT: normal Neck supple with no adenopathy JVP normal no bruits no thyromegaly Lungs clear with no wheezing and good diaphragmatic motion Heart:  S1/S2 no murmur,rub, gallop or click PMI normal Abdomen: benighn, BS positve, no tenderness, no AAA no bruit.  No HSM or HJR Distal pulses intact with no bruits No edema Neuro non-focal Skin warm and dry Tremor in upper extremities   Impression & Recommendations:  Problem # 1:  CARDIOMYOPATHY (ICD-425.4) Improved with lower heart rate.  F/U EF evaluation in a year Her updated medication list for this problem includes:    Lasix 40 Mg Tabs (Furosemide) .Marland Kitchen... 1 tab by mouth two times a day    Metoprolol Succinate 50 Mg Xr24h-tab (Metoprolol succinate) .Marland Kitchen... 1 1/2 tab by mouth once daily    Amiodarone Hcl 200 Mg Tabs (Amiodarone hcl) ..... Once daily    Coumadin 2.5 Mg Tabs (Warfarin sodium) .Marland Kitchen... Take as directed by coumadin clinic.  Problem # 2:  HYPERTENSION (ICD-401.9) Well controlled Her updated medication list for this problem includes:    Lasix 40 Mg Tabs (Furosemide) .Marland Kitchen... 1 tab by mouth two times a day    Metoprolol Succinate 50 Mg Xr24h-tab (Metoprolol succinate) .Marland Kitchen... 1 1/2 tab by mouth once daily  Problem # 3:  Hx of ATRIAL FIBRILLATION (ICD-427.31) Maint NSR will consider stopping coumadin in 6 months Her updated medication list for this problem includes:    Metoprolol Succinate 50 Mg Xr24h-tab (Metoprolol succinate) .Marland Kitchen... 1 1/2 tab by mouth once daily    Amiodarone Hcl 200 Mg Tabs (Amiodarone hcl) ..... Once daily    Coumadin 2.5 Mg Tabs  (Warfarin sodium) .Marland Kitchen... Take as directed by coumadin clinic.  Problem # 4:  COUMADIN THERAPY (ICD-V58.61) Been Rx with no bleeding.  F/U clinic today  Patient Instructions: 1)  Your physician recommends that you schedule a follow-up appointment in: 6 MONTHS

## 2010-04-09 NOTE — Assessment & Plan Note (Signed)
Summary: eph/jml   Referring Provider:  Dr. Charlton Haws Primary Provider:  Dr. Charlton Haws   History of Present Illness: Abigail Mahoney is seen today post hospital DC for afib, CHF.  She is doing well.  She was slow to ambulate and had a large echymosis on her right hip which has healed.  She is living on her own with nurnsing visits.  She continues to smoke and I counseled her on this.  She is not a candidate for Chantix given her other psychotropic drugs.  She has maintained NSR and is due to have her coumadin checked today.  She denies SSCP, palpitations, her breathing is much better and she is sleeping through the night.  She is having issues with her Risperdal and cogentin.  I think it is making her RUE tremor worse and she needs neurological F/U.  We will try to expeditie this for her.  Echo in hospital showed EF 25-30% with moderate to severe MR.  She is not an ideal candidate for MV surgery.  She has no known CAD  Current Problems (verified): 1)  Tremor  (ICD-781.0) 2)  Cardiomyopathy  (ICD-425.4) 3)  Hypertension  (ICD-401.9) 4)  Hx of Atrial Fibrillation  (ICD-427.31) 5)  CHF  (ICD-428.0) 6)  Coumadin Therapy  (ICD-V58.61) 7)  Bipolar Disorder Unspecified  (ICD-296.80) 8)  Anxiety  (ICD-300.00) 9)  Tobacco Abuse  (ICD-305.1) 10)  Shortness of Breath  (ICD-786.05) 11)  Obesity  (ICD-278.00)  Current Medications (verified): 1)  Risperdal 2 Mg Tabs (Risperidone) .... Once Daily 2)  Klor-Con M20 20 Meq Cr-Tabs (Potassium Chloride Crys Cr) .Marland Kitchen.. 1 Tab in The Morning and 1/2 Tab At Bedtime 3)  Lasix 40 Mg Tabs (Furosemide) .Marland Kitchen.. 1 Tab By Mouth Two Times A Day 4)  Metoprolol Succinate 100 Mg Xr24h-Tab (Metoprolol Succinate) .Marland Kitchen.. 1 Tab By Mouth Two Times A Day 5)  Coumadin 2.5 Mg Tabs (Warfarin Sodium) .... Take As Directed By Coumadin Clinic. 6)  Sotalol Hcl 80 Mg Tabs (Sotalol Hcl) .Marland Kitchen.. 1 Tab By Mouth Two Times A Day 7)  Digoxin 0.125 Mg Tabs (Digoxin) .Marland Kitchen.. 1 Tab By Mouth Once Daily 8)   Lisinopril 5 Mg Tabs (Lisinopril) .... Take One Tablet By Mouth Daily 9)  Cogentin 0.5mg  .... 1 Tab By Mouth Two Times A Day 10)  Depakote 500 Mg Tbec (Divalproex Sodium) .Marland Kitchen.. 1 Tab By Mouth Two Times A Day 11)  Methimazole 10 Mg Tabs (Methimazole) .Marland Kitchen.. 1 Tab By Mouth Two Times A Day  Allergies (verified): No Known Drug Allergies  Past History:  Past Medical History: Last updated: 10/03/2008 Current Problems:  CARDIOMYOPATHY (ICD-425.4) HYPERTENSION (ICD-401.9) CHF (ICD-428.0) COUMADIN THERAPY (ICD-V58.61) BIPOLAR DISORDER UNSPECIFIED (ICD-296.80) ANXIETY (ICD-300.00) TOBACCO ABUSE (ICD-305.1) SHORTNESS OF BREATH (ICD-786.05) OBESITY (ICD-278.00) Atrial Fibrillation (ICD-427.31) - July 2009 Cardiomyopathy/CHF - EF  30%-40%/global hypokinesis.  - July 2009 admit Admission rhythm is atrial fibrillation with rapid ventricular rate.       a.     Very difficult to achieve rate control secondary to tendency        to severe hypotension on medication.       b.     Failed Direct current cardioversion September 15, 2007.       c.     Started amiodarone September 16, 2007, with spontaneous conversion        of sinus rhythm after 72-hour load on September 18, 2007.   Past Surgical History: Last updated: 01/14/2008 September 16, 2007, transesophageal echocardiogram.  The EF  is 30-  40% with global hypokinesis, mild mitral regurgitation.  No left atrial  appendage clot.   Family History: Last updated: 01/14/2008 Significant for hypertension and diabetes on both   mother's and father's side.   Social History: Last updated: 01/14/2008 lives alone disability She lives by herself.  She is currently unemployed, and   she smokes about a pack of cigarettes per day. She is vague about smoking hx. Started smokng at age 89. Currently smokes 2 cig/day but has smoked as miuch as 2packs a day. She started crying when she spoke about smoking.      Review of Systems       Denies fever, malais, weight loss, blurry  vision, decreased visual acuity, cough, sputum, SOB, hemoptysis, pleuritic pain, palpitaitons, heartburn, abdominal pain, melena, lower extremity edema, claudication, or rash.   Vital Signs:  Patient profile:   55 year old female Height:      64 inches Weight:      194 pounds BMI:     33.42 Pulse rate:   68 / minute Resp:     12 per minute BP sitting:   115 / 70  (left arm)  Vitals Entered By: Kem Parkinson (October 30, 2009 8:59 AM)  Physical Exam  General:  Affect appropriate Healthy:  appears stated age HEENT: normal Neck supple with no adenopathy JVP normal no bruits no thyromegaly Lungs clear with no wheezing and good diaphragmatic motion Heart:  S1/S2 no murmur,rub, gallop or click PMI normal Abdomen: benighn, BS positve, no tenderness, no AAA no bruit.  No HSM or HJR Distal pulses intact with no bruits No edema Neuro non-focal RUE tremor Skin warm and dry    Impression & Recommendations:  Problem # 1:  TREMOR (ICD-781.0) Needs adjustment of respiridel and cogentin.  F/U neuro and psych Orders: Neurology Referral (Neuro)  Problem # 2:  CARDIOMYOPATHY (ICD-425.4) CHF improved continue current meds.  Weight done a lot The following medications were removed from the medication list:    Amiodarone Hcl 200 Mg Tabs (Amiodarone hcl) ..... Once daily Her updated medication list for this problem includes:    Lasix 40 Mg Tabs (Furosemide) .Marland Kitchen... 1 tab by mouth two times a day    Metoprolol Succinate 100 Mg Xr24h-tab (Metoprolol succinate) .Marland Kitchen... 1 tab by mouth two times a day    Coumadin 2.5 Mg Tabs (Warfarin sodium) .Marland Kitchen... Take as directed by coumadin clinic.    Sotalol Hcl 80 Mg Tabs (Sotalol hcl) .Marland Kitchen... 1 tab by mouth two times a day    Digoxin 0.125 Mg Tabs (Digoxin) .Marland Kitchen... 1 tab by mouth once daily    Lisinopril 5 Mg Tabs (Lisinopril) .Marland Kitchen... Take one tablet by mouth daily  Problem # 3:  Hx of ATRIAL FIBRILLATION (ICD-427.31) S/P [facility] with maint on Sotolol.   Amiodarone not effective.  Will need to be careful given decreased EF.  ? Sotolol started by [facility] in hospital.   Seems to be tolerating.  Will reassess EF with MRI in 3 months with maint of NSR.  No CAD The following medications were removed from the medication list:    Amiodarone Hcl 200 Mg Tabs (Amiodarone hcl) ..... Once daily Her updated medication list for this problem includes:    Metoprolol Succinate 100 Mg Xr24h-tab (Metoprolol succinate) .Marland Kitchen... 1 tab by mouth two times a day    Coumadin 2.5 Mg Tabs (Warfarin sodium) .Marland Kitchen... Take as directed by coumadin clinic.    Sotalol Hcl 80 Mg Tabs (Sotalol hcl) .Marland KitchenMarland KitchenMarland KitchenMarland Kitchen  1 tab by mouth two times a day    Digoxin 0.125 Mg Tabs (Digoxin) .Marland Kitchen... 1 tab by mouth once daily  Problem # 4:  COUMADIN THERAPY (ICD-V58.61) F/U coumadin clinic today.  Patient Instructions: 1)  Your physician recommends that you schedule a follow-up appointment in: 3 MONTHS 2)  You have been referred to [facility] [place]

## 2010-04-09 NOTE — Letter (Signed)
[facility]   Imported By: Marylou Mccoy 11/01/2009 13:53:24  _____________________________________________________________________  External Attachment:    Type:   Image     Comment:   External Document

## 2010-04-09 NOTE — Letter (Signed)
Summary: [facility] Visit [facility]   [facility] Visit Note   Imported By: Roderic Ovens 12/03/2009 10:51:46  _____________________________________________________________________  External Attachment:    Type:   Image     Comment:   External Document

## 2010-04-09 NOTE — Medication Information (Signed)
Summary: Abigail Mahoney  Anticoagulant Therapy  Managed by: Bethena Midget, RN, BSN Referring MD: Charlton Haws MD PCP: Dr. Charlton Haws Supervising MD: Eden Emms MD, Theron Arista Indication 1: Atrial Fibrillation (ICD-427.31) Lab Used: [facility] Site: [facility] INR POC 1.8 INR RANGE 2 - 3  Dietary changes: no    Health status changes: no    Bleeding/hemorrhagic complications: no    Recent/future hospitalizations: no    Any changes in medication regimen? no    Recent/future dental: no  Any missed doses?: no       Is patient compliant with meds? yes      Comments: Saw Dr. Eden Emms today.   Allergies: No Known Drug Allergies  Anticoagulation Management History:      The patient is taking warfarin and comes in today for a routine follow up visit.  Negative risk factors for bleeding include an age less than 35 years old.  The bleeding index is 'low risk'.  Positive CHADS2 values include History of CHF and History of HTN.  Negative CHADS2 values include Age > 60 years old.  The start date was 09/15/2007.  Anticoagulation responsible provider: Eden Emms MD, Theron Arista.  INR POC: 1.8.  Cuvette Lot#: [id number].  Exp: 07/2010.    Anticoagulation Management Assessment/Plan:      The patient's current anticoagulation dose is Coumadin 2.5 mg tabs: Take as directed by coumadin clinic..  The target INR is 2 - 3.  The next INR is due 06/01/2009.  Anticoagulation instructions were given to patient.  Results were reviewed/authorized by Bethena Midget, RN, BSN.  She was notified by Bethena Midget, RN, BSN.         Prior Anticoagulation Instructions: INR 3.8  No dose today.  Change dose to 2 tabs each Tuesday and 1 tab on all other days.    Recheck in 2 weeks.    Current Anticoagulation Instructions: INR 1.8 Change dose to 1 pill everyday except 2 pills on Tuesdays and Thursdays. Recheck in 2 weeks.

## 2010-04-09 NOTE — Medication Information (Signed)
Summary: rov/sp  Anticoagulant Therapy  Managed by: Bethena Midget, RN, BSN Referring MD: Charlton Haws MD PCP: Dr. Charlton Haws Supervising MD: Shirlee Latch MD, Dalton Indication 1: Atrial Fibrillation (ICD-427.31) Lab Used: [facility] Site: [facility] INR POC 5.0 INR RANGE 2 - 3  Dietary changes: no    Health status changes: no    Bleeding/hemorrhagic complications: no    Recent/future hospitalizations: no    Any changes in medication regimen? no    Recent/future dental: no  Any missed doses?: no       Is patient compliant with meds? yes       Allergies: No Known Drug Allergies  Anticoagulation Management History:      The patient is taking warfarin and comes in today for a routine follow up visit.  Negative risk factors for bleeding include an age less than 22 years old.  The bleeding index is 'low risk'.  Positive CHADS2 values include History of CHF and History of HTN.  Negative CHADS2 values include Age > 54 years old.  The start date was 09/15/2007.  Anticoagulation responsible provider: Shirlee Latch MD, Dalton.  INR POC: 5.0.  Cuvette Lot#: [id number].  Exp: 11/2010.    Anticoagulation Management Assessment/Plan:      The patient's current anticoagulation dose is Coumadin 2.5 mg tabs: Take as directed by coumadin clinic..  The target INR is 2 - 3.  The next INR is due 09/07/2009.  Anticoagulation instructions were given to patient.  Results were reviewed/authorized by Bethena Midget, RN, BSN.  She was notified by Bethena Midget, RN, BSN.         Prior Anticoagulation Instructions: INR 2.6  Continue same dose of 1 tablet every day except 2 tablets on Tuesday and Thursday  Current Anticoagulation Instructions: INR 5.0 Skip today and Saturday. Then resume 1 pill everyday except 2 pill on Tuesdays and Thursdays. Recheck in one week.

## 2010-04-09 NOTE — Medication Information (Signed)
Summary: rov/tm  Anticoagulant Therapy  Managed by: Cloyde Reams, RN, BSN Referring MD: Charlton Haws MD PCP: Dr. Charlton Haws Supervising MD: Jens Som MD, Arlys John Indication 1: Atrial Fibrillation (ICD-427.31) Lab Used: [facility] Site: [facility] INR POC 2.6 INR RANGE 2 - 3  Dietary changes: no    Health status changes: no    Bleeding/hemorrhagic complications: no    Recent/future hospitalizations: no    Any changes in medication regimen? no    Recent/future dental: no  Any missed doses?: no       Is patient compliant with meds? yes       Allergies (verified): No Known Drug Allergies  Anticoagulation Management History:      The patient is taking warfarin and comes in today for a routine follow up visit.  Negative risk factors for bleeding include an age less than 19 years old.  The bleeding index is 'low risk'.  Positive CHADS2 values include History of CHF and History of HTN.  Negative CHADS2 values include Age > 76 years old.  The start date was 09/15/2007.  Anticoagulation responsible provider: Jens Som MD, Arlys John.  INR POC: 2.6.  Cuvette Lot#: [id number].  Exp: 07/2010.    Anticoagulation Management Assessment/Plan:      The patient's current anticoagulation dose is Coumadin 2.5 mg tabs: Take as directed by coumadin clinic..  The target INR is 2 - 3.  The next INR is due 06/22/2009.  Anticoagulation instructions were given to patient.  Results were reviewed/authorized by Cloyde Reams, RN, BSN.  She was notified by Cloyde Reams RN.         Prior Anticoagulation Instructions: INR 1.8 Change dose to 1 pill everyday except 2 pills on Tuesdays and Thursdays. Recheck in 2 weeks.   Current Anticoagulation Instructions: INR 2.6  Continue on same dosage 1 tablet daily except 2 tablets on Tuesdays and Thursdays.  Recheck in 3 weeks.

## 2010-04-09 NOTE — Medication Information (Signed)
Summary: rov/jm  Anticoagulant Therapy  Managed by: Lyna Poser, PharmD Referring MD: Charlton Haws MD PCP: Dr. Charlton Haws Supervising MD: Ladona Ridgel MD, Sharlot Gowda Indication 1: Atrial Fibrillation (ICD-427.31) Lab Used: [facility] [place] Site: [address] INR POC 3.8 INR RANGE 2 - 3  Dietary changes: yes       Details: ate more greens yesterday and tuesday  Health status changes: no    Bleeding/hemorrhagic complications: no    Recent/future hospitalizations: no    Any changes in medication regimen? no    Recent/future dental: no  Any missed doses?: no       Is patient compliant with meds? yes       Allergies: No Known Drug Allergies  Anticoagulation Management History:      Negative risk factors for bleeding include an age less than 45 years old.  The bleeding index is 'low risk'.  Positive CHADS2 values include History of CHF and History of HTN.  Negative CHADS2 values include Age > 59 years old.  The start date was 09/15/2007.  Anticoagulation responsible provider: Ladona Ridgel MD, Sharlot Gowda.  INR POC: 3.8.  Exp: 12/2010.    Anticoagulation Management Assessment/Plan:      The patient's current anticoagulation dose is Warfarin sodium 5 mg tabs: Take as directed by Coumadin clinic.  The target INR is 2 - 3.  The next INR is due 12/10/2009.  Anticoagulation instructions were given to patient.  Results were reviewed/authorized by Lyna Poser, PharmD.         Prior Anticoagulation Instructions: INR 1.3  Take one extra 2.5 mg tablet today, then take 5 mg every day except for 2.5 mg on Monday and Wednesday.  We will see you in 11 days.    Current Anticoagulation Instructions: INR 3.8  Skip your dose tomorrow. Change your friday dose to a half of a tablet instead of 1. See you in 2 weeks.

## 2010-04-09 NOTE — Medication Information (Signed)
Summary: rov/sp  Anticoagulant Therapy  Managed by: Bethena Midget, RN, BSN Referring MD: Charlton Haws MD PCP: Dr. Charlton Haws Supervising MD: Tenny Craw MD, Gunnar Fusi Indication 1: Atrial Fibrillation (ICD-427.31) Lab Used: [facility] Site: [address] INR POC 2.2 INR RANGE 2 - 3  Dietary changes: no    Health status changes: no    Bleeding/hemorrhagic complications: no    Recent/future hospitalizations: no    Any changes in medication regimen? no    Recent/future dental: no  Any missed doses?: no       Is patient compliant with meds? yes       Allergies: No Known Drug Allergies  Anticoagulation Management History:      The patient is taking warfarin and comes in today for a routine follow up visit.  Negative risk factors for bleeding include an age less than 13 years old.  The bleeding index is 'low risk'.  Positive CHADS2 values include History of CHF and History of HTN.  Negative CHADS2 values include Age > 34 years old.  The start date was 09/15/2007.  Anticoagulation responsible provider: Tenny Craw MD, Gunnar Fusi.  INR POC: 2.2.  Cuvette Lot#: [id number].  Exp: 02/2011.    Anticoagulation Management Assessment/Plan:      The patient's current anticoagulation dose is Warfarin sodium 5 mg tabs: Take as directed by Coumadin clinic.  The target INR is 2 - 3.  The next INR is due 01/28/2010.  Anticoagulation instructions were given to patient.  Results were reviewed/authorized by Bethena Midget, RN, BSN.  She was notified by Bethena Midget, RN, BSN.         Prior Anticoagulation Instructions: INR 2.8  Your number looks great!  Continue 1 tablet every day except 1/2 tablet on Monday, Wednesday and Friday.  Recheck INR in 3 weeks.   Current Anticoagulation Instructions: INR 2.2 Continue 1 pill everyday except 1/2 pill on Mondays, Wednesdays and Fridays. Recheck in 4 weeks.

## 2010-04-09 NOTE — Assessment & Plan Note (Signed)
Summary: F3M/wa   Referring Provider:  Dr. Charlton Haws Primary Provider:  Dr. Charlton Haws  CC:  pt complains of low Bp...  History of Present Illness: Abigail Mahoney is seen today post hospital DC for afib, CHF.  She is doing well.  She was slow to ambulate and had a large echymosis on her right hip which has healed.  She is living on her own with nurnsing visits.  She continues to smoke and I counseled her on this.  She is not a candidate for Chantix given her other psychotropic drugs.  She has maintained NSR and would like home health to check her INR at assisted living which is fine.   She denies SSCP, palpitations, her breathing is much better and she is sleeping through the night.  She is having issues with her Risperdal and cogentin.  I think it is making her RUE tremor worse and she needs neurological F/U. This has been arranged with [facility] neuro on 11/8   We will try to expeditie this for her.  Echo in hospital showed EF 25-30% with moderate to severe MR.  She is not an ideal candidate for MV surgery.  She has no known CAD  Current Problems (verified): 1)  Tremor  (ICD-781.0) 2)  Cardiomyopathy  (ICD-425.4) 3)  Hypertension  (ICD-401.9) 4)  Hx of Atrial Fibrillation  (ICD-427.31) 5)  CHF  (ICD-428.0) 6)  Coumadin Therapy  (ICD-V58.61) 7)  Bipolar Disorder Unspecified  (ICD-296.80) 8)  Anxiety  (ICD-300.00) 9)  Tobacco Abuse  (ICD-305.1) 10)  Shortness of Breath  (ICD-786.05) 11)  Obesity  (ICD-278.00)  Current Medications (verified): 1)  Risperdal 2 Mg Tabs (Risperidone) .... Once Daily 2)  Klor-Con M20 20 Meq Cr-Tabs (Potassium Chloride Crys Cr) .Marland Kitchen.. 1 Tab in The Morning and 1/2 Tab At Bedtime 3)  Lasix 40 Mg Tabs (Furosemide) .Marland Kitchen.. 1 Tab By Mouth Two Times A Day 4)  Metoprolol Succinate 100 Mg Xr24h-Tab (Metoprolol Succinate) .Marland Kitchen.. 1 Tab By Mouth Two Times A Day 5)  Warfarin Sodium 5 Mg Tabs (Warfarin Sodium) .... Take As Directed By Coumadin Clinic 6)  Sotalol Hcl 80 Mg Tabs  (Sotalol Hcl) .Marland Kitchen.. 1 Tab By Mouth Two Times A Day 7)  Digoxin 0.125 Mg Tabs (Digoxin) .Marland Kitchen.. 1 Tab By Mouth Once Daily 8)  Lisinopril 5 Mg Tabs (Lisinopril) .... Take One Tablet By Mouth Daily 9)  Depakote 500 Mg Tbec (Divalproex Sodium) .Marland Kitchen.. 1 Tab By Mouth Two Times A Day 10)  Methimazole 10 Mg Tabs (Methimazole) .Marland Kitchen.. 1 Tab By Mouth Two Times A Day 11)  Benztropine Mesylate 0.5 Mg Tabs (Benztropine Mesylate) .Marland Kitchen.. 1 Tab By Mouth Two Times A Day  Allergies (verified): No Known Drug Allergies  Past History:  Past Medical History: Last updated: 10/03/2008 Current Problems:  CARDIOMYOPATHY (ICD-425.4) HYPERTENSION (ICD-401.9) CHF (ICD-428.0) COUMADIN THERAPY (ICD-V58.61) BIPOLAR DISORDER UNSPECIFIED (ICD-296.80) ANXIETY (ICD-300.00) TOBACCO ABUSE (ICD-305.1) SHORTNESS OF BREATH (ICD-786.05) OBESITY (ICD-278.00) Atrial Fibrillation (ICD-427.31) - July 2009 Cardiomyopathy/CHF - EF  30%-40%/global hypokinesis.  - July 2009 admit Admission rhythm is atrial fibrillation with rapid ventricular rate.       a.     Very difficult to achieve rate control secondary to tendency        to severe hypotension on medication.       b.     Failed Direct current cardioversion September 15, 2007.       c.     Started amiodarone September 16, 2007, with spontaneous conversion  of sinus rhythm after 72-hour load on September 18, 2007.   Past Surgical History: Last updated: 01/14/2008 September 16, 2007, transesophageal echocardiogram.  The EF is 30-  40% with global hypokinesis, mild mitral regurgitation.  No left atrial  appendage clot.   Family History: Last updated: 01/14/2008 Significant for hypertension and diabetes on both   mother's and father's side.   Social History: Last updated: 01/14/2008 lives alone disability She lives by herself.  She is currently unemployed, and   she smokes about a pack of cigarettes per day. She is vague about smoking hx. Started smokng at age 76. Currently smokes 2 cig/day but  has smoked as miuch as 2packs a day. She started crying when she spoke about smoking.      Review of Systems       Denies fever, malais, weight loss, blurry vision, decreased visual acuity, cough, sputum, SOB, hemoptysis, pleuritic pain, palpitaitons, heartburn, abdominal pain, melena, lower extremity edema, claudication, or rash.   Vital Signs:  Patient profile:   55 year old female Height:      64 inches Weight:      190 pounds BMI:     32.73 Pulse rate:   70 / minute Pulse rhythm:   regular Resp:     14 per minute BP supine:   100 / 60  Vitals Entered By: Kem Parkinson (January 17, 2010 11:23 AM)  Physical Exam  General:  Affect appropriate Healthy:  appears stated age HEENT: normal Neck supple with no adenopathy JVP normal no bruits no thyromegaly Lungs clear with no wheezing and good diaphragmatic motion Heart:  S1/S2 no murmur,rub, gallop or click PMI normal Abdomen: benighn, BS positve, no tenderness, no AAA no bruit.  No HSM or HJR Distal pulses intact with no bruits No edema Neuro non-focal Skin warm and dry    Impression & Recommendations:  Problem # 1:  TREMOR (ICD-781.0) F/U [facility]  Slightly improved Thyroid dose decreased by Lucianne Muss  Problem # 2:  CARDIOMYOPATHY (ICD-425.4) Non ischemic with sig MR not an operative candidate.  Continue current meds  Only decrease if BUN/Cr elevated.   Her updated medication list for this problem includes:    Lasix 40 Mg Tabs (Furosemide) .Marland Kitchen... 1 tab by mouth two times a day    Metoprolol Succinate 100 Mg Xr24h-tab (Metoprolol succinate) .Marland Kitchen... 1 tab by mouth two times a day    Warfarin Sodium 5 Mg Tabs (Warfarin sodium) .Marland Kitchen... Take as directed by coumadin clinic    Sotalol Hcl 80 Mg Tabs (Sotalol hcl) .Marland Kitchen... 1 tab by mouth two times a day    Digoxin 0.125 Mg Tabs (Digoxin) .Marland Kitchen... 1 tab by mouth once daily    Lisinopril 5 Mg Tabs (Lisinopril) .Marland Kitchen... Take one tablet by mouth daily  Problem # 3:  Hx of ATRIAL  FIBRILLATION (ICD-427.31) Maint NSR  coumadin check at our clinic until home health able to do it Her updated medication list for this problem includes:    Metoprolol Succinate 100 Mg Xr24h-tab (Metoprolol succinate) .Marland Kitchen... 1 tab by mouth two times a day    Warfarin Sodium 5 Mg Tabs (Warfarin sodium) .Marland Kitchen... Take as directed by coumadin clinic    Sotalol Hcl 80 Mg Tabs (Sotalol hcl) .Marland Kitchen... 1 tab by mouth two times a day    Digoxin 0.125 Mg Tabs (Digoxin) .Marland Kitchen... 1 tab by mouth once daily  Problem # 4:  HYPERTENSION (ICD-401.9) Low at all times now.  If BUN/CR up and BNP normal  will cut back on Lasix Her updated medication list for this problem includes:    Lasix 40 Mg Tabs (Furosemide) .Marland Kitchen... 1 tab by mouth two times a day    Metoprolol Succinate 100 Mg Xr24h-tab (Metoprolol succinate) .Marland Kitchen... 1 tab by mouth two times a day    Sotalol Hcl 80 Mg Tabs (Sotalol hcl) .Marland Kitchen... 1 tab by mouth two times a day    Lisinopril 5 Mg Tabs (Lisinopril) .Marland Kitchen... Take one tablet by mouth daily  Patient Instructions: 1)  Your physician recommends that you schedule a follow-up appointment in: 6 MONTHS

## 2010-04-09 NOTE — Progress Notes (Signed)
Summary: calling about low B/P- waiting for pt to call back  Phone Note Call from Patient Call back at [phone]   Caller: Sister/Marnita Coble Summary of Call: Pt sister calling about the pt medication and B/P and today the B/P was 70/50 and the family is worrried about low B/P Initial call taken by: Judie Grieve,  January 09, 2010 1:18 PM  Follow-up for Phone Call        Home Health RN took her BP which was 70/50 which she took a few times. Pt. is feeling okay. HR 60. Will review with Dr. Eden Emms. Pt. has not had her Metoprolol in the past 2 weeks. I called pt. to see if she wanted to come in for a BP check. I can't get in touch with her. Dr. Eden Emms wants her to dec. her Lasix to 40mg  by mouth daily & check a U/A and culture with a BMP and BNP at her next OV with Dr. Eden Emms which is sch.for 11/14. Her home health RN is aware of these med. changes and I have instructed pt. not to take any of her BP meds. today. Her HH RN will recheck her BP tomorrow and further assess then.  Whitney Maeola Sarah RN  January 09, 2010 1:26 PM  Follow-up by: Whitney Maeola Sarah RN,  January 09, 2010 1:25 PM     Appended Document: calling about low B/P- waiting for pt to call back    Clinical Lists Changes  Medications: Changed medication from LASIX 40 MG TABS (FUROSEMIDE) 1 tab by mouth two times a day to LASIX 40 MG TABS (FUROSEMIDE) 1 tab by mouth daily.

## 2010-04-09 NOTE — Medication Information (Signed)
Summary: rov/sl  Anticoagulant Therapy  Managed by: Eda Keys, PharmD Referring MD: Charlton Haws MD PCP: Dr. Charlton Haws Supervising MD: Ladona Ridgel MD, Sharlot Gowda Indication 1: Atrial Fibrillation (ICD-427.31) Lab Used: [facility] [facility] [place] Site: [address] INR POC 1.3 INR RANGE 2 - 3  Dietary changes: no    Health status changes: no    Bleeding/hemorrhagic complications: no    Recent/future hospitalizations: no    Any changes in medication regimen? no    Recent/future dental: no  Any missed doses?: no       Is patient compliant with meds? yes       Allergies: No Known Drug Allergies  Anticoagulation Management History:      The patient is taking warfarin and comes in today for a routine follow up visit.  Negative risk factors for bleeding include an age less than 74 years old.  The bleeding index is 'low risk'.  Positive CHADS2 values include History of CHF and History of HTN.  Negative CHADS2 values include Age > 33 years old.  The start date was 09/15/2007.  Anticoagulation responsible provider: Ladona Ridgel MD, Sharlot Gowda.  INR POC: 1.3.  Cuvette Lot#: [id number].  Exp: 12/2010.    Anticoagulation Management Assessment/Plan:      The patient's current anticoagulation dose is Warfarin sodium 5 mg tabs: Take as directed by Coumadin clinic.  The target INR is 2 - 3.  The next INR is due 11/26/2009.  Anticoagulation instructions were given to patient.  Results were reviewed/authorized by Eda Keys, PharmD.  She was notified by Kennieth Francois.         Prior Anticoagulation Instructions: INR 1.7  Tomorrow, Wednesday, August 24th, take Coumadin 2 tabs (5 mg). Then, take Coumadin 2 tabs (5 mg) on Sun, Tues, Thur, Sat and Coumadin 1 tab (2.5 mg) on Mon, Wed, Fri. Return to clinic in 2 weeks.   Current Anticoagulation Instructions: INR 1.3  Take one extra 2.5 mg tablet today, then take 5 mg every day except for 2.5 mg on Monday and Wednesday.  We will see you in 11  days.   Prescriptions: WARFARIN SODIUM 5 MG TABS (WARFARIN SODIUM) Take as directed by Coumadin clinic  #30 x 3   Entered by:   Lyna Poser PharmD   Authorized by:   Colon Branch, MD, [facility]   Signed by:   Lyna Poser PharmD on 11/15/2009   Method used:   Electronically to        [id number] (retail)       [address]       [place], [place]  [address]       Ph: [phone] or [phone]       Fax: [phone]   RxID:   [id number]

## 2010-04-09 NOTE — Medication Information (Signed)
Summary: [facility] Clinic  Anticoagulant Therapy  Managed by: Weston Brass, PharmD Referring MD: Charlton Haws MD PCP: Dr. Charlton Haws Supervising MD: Clifton James MD, Cristal Deer Indication 1: Atrial Fibrillation (ICD-427.31) Lab Used: [facility] [place] [facility] [place] Site: [address] PT 22.4 INR POC 1.9 INR RANGE 2 - 3  Dietary changes: no    Health status changes: no    Bleeding/hemorrhagic complications: no    Recent/future hospitalizations: yes       Details: pt discharged from the hospital last week for Afib with RVR.  Found to have hyperthyroidism as well.  Seen by endocrinology.   Any changes in medication regimen? yes       Details: started on Sotalol and tapazole in hospital  Recent/future dental: no  Any missed doses?: no       Is patient compliant with meds? yes       Allergies: No Known Drug Allergies  Anticoagulation Management History:      Her anticoagulation is being managed by telephone today.  Negative risk factors for bleeding include an age less than 25 years old.  The bleeding index is 'low risk'.  Positive CHADS2 values include History of CHF and History of HTN.  Negative CHADS2 values include Age > 29 years old.  The start date was 09/15/2007.  Prothrombin time is 22.4.  Anticoagulation responsible [name]: Clifton James MD, Cristal Deer.  INR POC: 1.9.  Exp: 11/2010.    Anticoagulation Management Assessment/Plan:      The patient's current anticoagulation dose is Coumadin 2.5 mg tabs: Take as directed by coumadin clinic..  The target INR is 2 - 3.  The next INR is due 10/22/2009.  Anticoagulation instructions were given to patient.  Results were reviewed/authorized by Weston Brass, PharmD.  She was notified by Weston Brass PharmD.         Prior Anticoagulation Instructions: INR 2.4  Continue on same dosage 1 tablet daily except 2 tablets on Tuesdays and Thursdays.  Kepp eating your green leafy vegetables 2-3 times a week.  Recheck in 2 weeks.    Current  Anticoagulation Instructions: INR 1.9  Spoke with Beth with [facility] while in pt's home.  Restart previous dose of 2.5mg  daily except 5mg  on Tuesday and Thursday.  Recheck INR in 1 week.

## 2010-04-09 NOTE — Consult Note (Signed)
[facility]   Imported By: Roderic Ovens 10/08/2009 16:01:30  _____________________________________________________________________  External Attachment:    Type:   Image     Comment:   External Document

## 2010-04-09 NOTE — Medication Information (Signed)
Summary: rov/cb  Anticoagulant Therapy  Managed by: Bethena Midget, RN, BSN Referring MD: Charlton Haws MD PCP: Dr. Charlton Haws Supervising MD: Jens Som MD, Arlys John Indication 1: Atrial Fibrillation (ICD-427.31) Lab Used: [facility] Site: [facility] INR POC 1.7 INR RANGE 2 - 3  Dietary changes: no    Health status changes: no    Bleeding/hemorrhagic complications: no    Recent/future hospitalizations: no    Any changes in medication regimen? no    Recent/future dental: no  Any missed doses?: no       Is patient compliant with meds? yes       Allergies: No Known Drug Allergies  Anticoagulation Management History:      The patient is taking warfarin and comes in today for a routine follow up visit.  Negative risk factors for bleeding include an age less than 53 years old.  The bleeding index is 'low risk'.  Positive CHADS2 values include History of CHF and History of HTN.  Negative CHADS2 values include Age > 32 years old.  The start date was 09/15/2007.  Anticoagulation responsible provider: Jens Som MD, Arlys John.  INR POC: 1.7.  Cuvette Lot#: [id number].  Exp: 08/2010.    Anticoagulation Management Assessment/Plan:      The patient's current anticoagulation dose is Coumadin 2.5 mg tabs: Take as directed by coumadin clinic..  The target INR is 2 - 3.  The next INR is due 07/27/2009.  Anticoagulation instructions were given to patient.  Results were reviewed/authorized by Bethena Midget, RN, BSN.  She was notified by Bethena Midget, RN, BSN.         Prior Anticoagulation Instructions: INR 2.5. Take 1 tablet daily except 2 tablets on Tues and Thurs.  Current Anticoagulation Instructions: INR 1.7 Today take 5mg s then resume 2.5mg  daily except 5mg  on Tuesdays and Thursdays. Recheck in 2 weeks.

## 2010-04-09 NOTE — Medication Information (Signed)
Summary: rov/ewj  Anticoagulant Therapy  Managed by: Bethena Midget, RN, BSN Referring MD: Charlton Haws MD PCP: Dr. Charlton Haws Supervising MD: Johney Frame MD, Fayrene Fearing Indication 1: Atrial Fibrillation (ICD-427.31) Lab Used: [facility] Site: [facility] INR POC 4.9 INR RANGE 2 - 3  Dietary changes: no    Health status changes: no    Bleeding/hemorrhagic complications: no    Recent/future hospitalizations: no    Any changes in medication regimen? no    Recent/future dental: no  Any missed doses?: no       Is patient compliant with meds? yes       Allergies: No Known Drug Allergies  Anticoagulation Management History:      The patient is taking warfarin and comes in today for a routine follow up visit.  Negative risk factors for bleeding include an age less than 11 years old.  The bleeding index is 'low risk'.  Positive CHADS2 values include History of CHF and History of HTN.  Negative CHADS2 values include Age > 38 years old.  The start date was 09/15/2007.  Anticoagulation responsible provider: Shatonya Passon MD, Fayrene Fearing.  INR POC: 4.9.  Cuvette Lot#: [id number].  Exp: 06/2010.    Anticoagulation Management Assessment/Plan:      The patient's current anticoagulation dose is Coumadin 2.5 mg tabs: Take as directed by coumadin clinic., Warfarin sodium 2.5 mg tabs: Marland Kitchen  The target INR is 2 - 3.  The next INR is due 04/27/2009.  Anticoagulation instructions were given to patient.  Results were reviewed/authorized by Bethena Midget, RN, BSN.  She was notified by Bethena Midget, RN, BSN.         Prior Anticoagulation Instructions: INR 5.7  Skip Saturday and Sunday's dose of coumadin, then start taking 1 tablet daily except 2 tablets on Sundays, Tuesdays, and Thursdays.  Recheck in 1 week.    Current Anticoagulation Instructions: INR 4.9 Skip today then change dose to 2.5mg s everyday except 5mg s on Tuesdays and Thursdays. Recheck on 04/27/09.

## 2010-04-09 NOTE — Medication Information (Signed)
Summary: rov/tm  Anticoagulant Therapy  Managed by: Cloyde Reams, RN, BSN Referring MD: Charlton Haws MD PCP: Dr. Charlton Haws Supervising MD: Tenny Craw MD, Gunnar Fusi Indication 1: Atrial Fibrillation (ICD-427.31) Lab Used: [facility] Site: [facility] INR POC 2.4 INR RANGE 2 - 3  Dietary changes: no    Health status changes: no    Bleeding/hemorrhagic complications: no    Recent/future hospitalizations: no    Any changes in medication regimen? no    Recent/future dental: no  Any missed doses?: no       Is patient compliant with meds? yes       Allergies: No Known Drug Allergies  Anticoagulation Management History:      The patient is taking warfarin and comes in today for a routine follow up visit.  Negative risk factors for bleeding include an age less than 12 years old.  The bleeding index is 'low risk'.  Positive CHADS2 values include History of CHF and History of HTN.  Negative CHADS2 values include Age > 17 years old.  The start date was 09/15/2007.  Anticoagulation responsible provider: Tenny Craw MD, Gunnar Fusi.  INR POC: 2.4.  Cuvette Lot#: [id number].  Exp: 11/2010.    Anticoagulation Management Assessment/Plan:      The patient's current anticoagulation dose is Coumadin 2.5 mg tabs: Take as directed by coumadin clinic..  The target INR is 2 - 3.  The next INR is due 09/21/2009.  Anticoagulation instructions were given to patient.  Results were reviewed/authorized by Cloyde Reams, RN, BSN.  She was notified by Cloyde Reams, RN, BSN.         Prior Anticoagulation Instructions: INR 5.0 Skip today and Saturday. Then resume 1 pill everyday except 2 pill on Tuesdays and Thursdays. Recheck in one week.   Current Anticoagulation Instructions: INR 2.4  Continue on same dosage 1 tablet daily except 2 tablets on Tuesdays and Thursdays.  Kepp eating your green leafy vegetables 2-3 times a week.  Recheck in 2 weeks.

## 2010-04-09 NOTE — Medication Information (Signed)
Summary: rov/sp  Anticoagulant Therapy  Managed by: Cloyde Reams, RN, BSN Referring MD: Charlton Haws MD PCP: Dr. Charlton Haws Supervising MD: Tenny Craw MD, Gunnar Fusi Indication 1: Atrial Fibrillation (ICD-427.31) Lab Used[facility]  Site: [facility] INR POC 5.7 INR RANGE 2 - 3  Dietary changes: no    Health status changes: no    Bleeding/hemorrhagic complications: no    Recent/future hospitalizations: no    Any changes in medication regimen? no    Recent/future dental: no  Any missed doses?: no       Is patient compliant with meds? yes       Allergies (verified): No Known Drug Allergies  Anticoagulation Management History:      The patient is taking warfarin and comes in today for a routine follow up visit.  Negative risk factors for bleeding include an age less than 17 years old.  The bleeding index is 'low risk'.  Positive CHADS2 values include History of CHF and History of HTN.  Negative CHADS2 values include Age > 19 years old.  The start date was 09/15/2007.  Anticoagulation responsible provider: Tenny Craw MD, Gunnar Fusi.  INR POC: 5.7.  Cuvette Lot#: [id number].  Exp: 06/2010.    Anticoagulation Management Assessment/Plan:      The patient's current anticoagulation dose is Coumadin 2.5 mg tabs: Take as directed by coumadin clinic., Warfarin sodium 2.5 mg tabs: Marland Kitchen  The target INR is 2 - 3.  The next INR is due 04/13/2009.  Anticoagulation instructions were given to patient.  Results were reviewed/authorized by Cloyde Reams, RN, BSN.  She was notified by Cloyde Reams RN.         Prior Anticoagulation Instructions: INR 1.9  Take 1 extra tablet (green pill) today when you get home then continue the same dose of 2 tablets every day except 1 tablet on Monday, Wednesday and Friday   Current Anticoagulation Instructions: INR 5.7  Skip Saturday and Sunday's dose of coumadin, then start taking 1 tablet daily except 2 tablets on Sundays, Tuesdays, and Thursdays.  Recheck in 1 week.

## 2010-04-09 NOTE — Medication Information (Signed)
Summary: rov/tm  Anticoagulant Therapy  Managed by: Shelby Dubin, PharmD, BCPS, CPP Referring MD: Charlton Haws MD PCP: Dr. Charlton Haws Supervising MD: Jens Som MD, Arlys John Indication 1: Atrial Fibrillation (ICD-427.31) Lab Used: [facility] Site: [facility] INR POC 3.8 INR RANGE 2 - 3  Dietary changes: no    Health status changes: no    Bleeding/hemorrhagic complications: no    Recent/future hospitalizations: no    Any changes in medication regimen? no    Recent/future dental: no  Any missed doses?: no       Is patient compliant with meds? yes      Comments: Sister helps with meds...took last Friday's dose instead of skipping per sister's report..mp  Current Medications (verified): 1)  Risperdal 2 Mg Tabs (Risperidone) .... Once Daily 2)  Divalproex Sodium 500 Mg Tbec (Divalproex Sodium) .... Two Times A Day 3)  Detrol La 4 Mg Xr24h-Cap (Tolterodine Tartrate) .... Once Daily 4)  Klor-Con M20 20 Meq Cr-Tabs (Potassium Chloride Crys Cr) .Marland Kitchen.. 1 Tab in The Morning and 1/2 Tab At Bedtime 5)  Lasix 40 Mg Tabs (Furosemide) .Marland Kitchen.. 1 Tab By Mouth Two Times A Day 6)  Metoprolol Succinate 50 Mg Xr24h-Tab (Metoprolol Succinate) .Marland Kitchen.. 1 1/2 Tab By Mouth Once Daily 7)  Amiodarone Hcl 200 Mg Tabs (Amiodarone Hcl) .... Once Daily 8)  Coumadin 2.5 Mg Tabs (Warfarin Sodium) .... Take As Directed By Coumadin Clinic.  Allergies (verified): No Known Drug Allergies  Anticoagulation Management History:      The patient is taking warfarin and comes in today for a routine follow up visit.  Negative risk factors for bleeding include an age less than 31 years old.  The bleeding index is 'low risk'.  Positive CHADS2 values include History of CHF and History of HTN.  Negative CHADS2 values include Age > 68 years old.  The start date was 09/15/2007.  Anticoagulation responsible provider: Jens Som MD, Arlys John.  INR POC: 3.8.  Cuvette Lot#: [id number].  Exp: 06/2010.    Anticoagulation Management  Assessment/Plan:      The patient's current anticoagulation dose is Coumadin 2.5 mg tabs: Take as directed by coumadin clinic..  The target INR is 2 - 3.  The next INR is due 05/11/2009.  Anticoagulation instructions were given to patient.  Results were reviewed/authorized by Shelby Dubin, PharmD, BCPS, CPP.  She was notified by Shelby Dubin PharmD, BCPS, CPP.         Prior Anticoagulation Instructions: INR 4.9 Skip today then change dose to 2.5mg s everyday except 5mg s on Tuesdays and Thursdays. Recheck on 04/27/09.   Current Anticoagulation Instructions: INR 3.8  No dose today.  Change dose to 2 tabs each Tuesday and 1 tab on all other days.    Recheck in 2 weeks.

## 2010-04-09 NOTE — Letter (Signed)
Summary: [facility] - Plan of Care  [facility] Care   Imported By: Marylou Mccoy 12/27/2009 14:20:55  _____________________________________________________________________  External Attachment:    Type:   Image     Comment:   External Document

## 2010-04-09 NOTE — Medication Information (Signed)
Summary: rov/tm  Anticoagulant Therapy  Managed by: Eda Keys, PharmD Referring MD: Charlton Haws MD PCP: Dr. Charlton Haws Supervising MD: Myrtis Ser MD, Tinnie Gens Indication 1: Atrial Fibrillation (ICD-427.31) Lab Used: [facility] Site: [facility] INR POC 1.8 INR RANGE 2 - 3  Dietary changes: no    Health status changes: no    Bleeding/hemorrhagic complications: no    Recent/future hospitalizations: no    Any changes in medication regimen? no    Recent/future dental: no  Any missed doses?: no       Is patient compliant with meds? yes       Allergies: No Known Drug Allergies  Anticoagulation Management History:      Negative risk factors for bleeding include an age less than 56 years old.  The bleeding index is 'low risk'.  Positive CHADS2 values include History of CHF and History of HTN.  Negative CHADS2 values include Age > 105 years old.  The start date was 09/15/2007.  Anticoagulation responsible provider: Myrtis Ser MD, Tinnie Gens.  INR POC: 1.8.  Exp: 08/2010.    Anticoagulation Management Assessment/Plan:      The patient's current anticoagulation dose is Coumadin 2.5 mg tabs: Take as directed by coumadin clinic..  The target INR is 2 - 3.  The next INR is due 08/10/2009.  Anticoagulation instructions were given to patient.  Results were reviewed/authorized by Eda Keys, PharmD.  She was notified by Eda Keys.         Prior Anticoagulation Instructions: INR 1.7 Today take 5mg s then resume 2.5mg  daily except 5mg  on Tuesdays and Thursdays. Recheck in 2 weeks.   Current Anticoagulation Instructions: INR 1.8  Take 1 extra tablet today.  Then return to normal dosing schedule of 2 tablets on Tuesday and Thursday and 1 tablet all other days.  Return to clinic in 2 weeks.

## 2010-04-09 NOTE — Medication Information (Signed)
Summary: Coumadin Clinic  Anticoagulant Therapy  Managed by: Bethena Midget, RN, BSN Referring MD: Charlton Haws MD PCP: Dr. Charlton Haws Supervising MD: Shirlee Latch MD, Freida Busman Indication 1: Atrial Fibrillation (ICD-427.31) Lab Used: [facility] [place] [place] Site: [address] PT 17.8 INR POC 1.5 INR RANGE 2 - 3  Dietary changes: no    Health status changes: no    Bleeding/hemorrhagic complications: no    Recent/future hospitalizations: no    Any changes in medication regimen? no    Recent/future dental: no  Any missed doses?: no       Is patient compliant with meds? yes      Comments: Per [facility] nurse they are discharging pt today.   Allergies: No Known Drug Allergies  Anticoagulation Management History:      Her anticoagulation is being managed by telephone today.  Negative risk factors for bleeding include an age less than 60 years old.  The bleeding index is 'low risk'.  Positive CHADS2 values include History of CHF and History of HTN.  Negative CHADS2 values include Age > 36 years old.  The start date was 09/15/2007.  Prothrombin time is 17.8.  Anticoagulation responsible provider: Shirlee Latch MD, [place].  INR POC: 1.5.    Anticoagulation Management Assessment/Plan:      The patient's current anticoagulation dose is Coumadin 2.5 mg tabs: Take as directed by coumadin clinic..  The target INR is 2 - 3.  The next INR is due 10/29/2009.  Anticoagulation instructions were given to patient.  Results were reviewed/authorized by Bethena Midget, RN, BSN.  She was notified by Bethena Midget, RN, BSN.         Prior Anticoagulation Instructions: INR 1.9  Spoke with Beth with [facility] while in pt's home.  Restart previous dose of 2.5mg  daily except 5mg  on Tuesday and Thursday.  Recheck INR in 1 week.   Current Anticoagulation Instructions: INR 1.5 Today 5mg  then change dose to 2.5mg s daily except 5mg s on TT&Sat. Recheck in one week.

## 2010-04-09 NOTE — Medication Information (Signed)
Summary: rov/tm  Anticoagulant Therapy  Managed by: Reina Fuse, PharmD Referring MD: Charlton Haws MD PCP: Dr. Charlton Haws Supervising MD: Eden Emms MD, Theron Arista Indication 1: Atrial Fibrillation (ICD-427.31) Lab Used: [facility] [place] Site: [address] INR POC 2.6 INR RANGE 2 - 3  Dietary changes: no    Health status changes: no    Bleeding/hemorrhagic complications: no    Recent/future hospitalizations: no    Any changes in medication regimen? no    Recent/future dental: no  Any missed doses?: no       Is patient compliant with meds? yes       Allergies: No Known Drug Allergies  Anticoagulation Management History:      The patient is taking warfarin and comes in today for a routine follow up visit.  Negative risk factors for bleeding include an age less than 44 years old.  The bleeding index is 'low risk'.  Positive CHADS2 values include History of CHF and History of HTN.  Negative CHADS2 values include Age > 81 years old.  The start date was 09/15/2007.  Anticoagulation responsible provider: Eden Emms MD, Theron Arista.  INR POC: 2.6.  Cuvette Lot#: [id number].  Exp: 02/2011.    Anticoagulation Management Assessment/Plan:      The patient's current anticoagulation dose is Warfarin sodium 5 mg tabs: Take as directed by Coumadin clinic.  The target INR is 2 - 3.  The next INR is due 02/25/2010.  Anticoagulation instructions were given to patient.  Results were reviewed/authorized by Reina Fuse, PharmD.  She was notified by Reina Fuse PharmD.         Prior Anticoagulation Instructions: INR 2.2 Continue 1 pill everyday except 1/2 pill on Mondays, Wednesdays and Fridays. Recheck in 4 weeks.   Current Anticoagulation Instructions: INR 2.6  Continue taking Coumadin 1 tab (5 mg) on Sun, Tues, Thur, Sat and Coumadin 0.5 tab (2.5 mg) on Mon, Wed, Fri. Return to clinic in 4 weeks.

## 2010-04-11 NOTE — Medication Information (Signed)
Summary: Coumadin Clinic  Anticoagulant Therapy  Managed by: Weston Brass, PharmD Referring MD: Charlton Haws MD PCP: Dr. Charlton Haws Supervising MD: Juanda Chance MD, Bruce Indication 1: Atrial Fibrillation (ICD-427.31) Lab Used: [facility] [place] Site: [address] INR POC 1.4 INR RANGE 2 - 3  Dietary changes: no    Health status changes: no    Bleeding/hemorrhagic complications: no    Recent/future hospitalizations: no    Any changes in medication regimen? no    Recent/future dental: no  Any missed doses?: yes     Details: may have missed 1 dose  Is patient compliant with meds? yes       Allergies: No Known Drug Allergies  Anticoagulation Management History:      Her anticoagulation is being managed by telephone today.  Negative risk factors for bleeding include an age less than 26 years old.  The bleeding index is 'low risk'.  Positive CHADS2 values include History of CHF and History of HTN.  Negative CHADS2 values include Age > 70 years old.  The start date was 09/15/2007.  Her last INR was 1.4 ratio.  Anticoagulation responsible Davelyn Gwinn: Juanda Chance MD, Smitty Cords.  INR POC: 1.4.  Exp: 02/2011.    Anticoagulation Management Assessment/Plan:      The patient's current anticoagulation dose is Warfarin sodium 5 mg tabs: Take as directed by Coumadin clinic.  The target INR is 2 - 3.  The next INR is due 02/28/2010.  Anticoagulation instructions were given to patient.  Results were reviewed/authorized by Weston Brass, PharmD.  She was notified by Weston Brass PharmD.         Prior Anticoagulation Instructions: INR 2.6  Continue taking Coumadin 1 tab (5 mg) on Sun, Tues, Thur, Sat and Coumadin 0.5 tab (2.5 mg) on Mon, Wed, Fri. Return to clinic in 4 weeks.  Current Anticoagulation Instructions: INR 1.4  Spoke with pt and pt's mother.  Take extra 5mg  x 2 days then resume same dose of 1 tablet every day except 1/2 tablet on Monday, Wednesday and Friday.  Recheck INR in 10  days.  Orders faxed to [facility] with [place]

## 2010-04-11 NOTE — Medication Information (Signed)
Summary: Lab Orders  Lab Orders   Imported By: Marylou Mccoy 03/14/2010 17:23:44  _____________________________________________________________________  External Attachment:    Type:   Image     Comment:   External Document

## 2010-04-11 NOTE — Progress Notes (Signed)
Summary: need Coumadin dosage  Phone Note Other Incoming   Caller: Carolyn/ [phone] Summary of Call: Nurse need Coumadin dosage Initial call taken by: Judie Grieve,  February 18, 2010 2:33 PM  Follow-up for Phone Call        Verifed Coumadin dose with Eber Jones.  She is checking PT/INR for pt today.  Follow-up by: Weston Brass PharmD,  February 18, 2010 2:45 PM

## 2010-04-11 NOTE — Medication Information (Signed)
Summary: Coumadin Clinic  Anticoagulant Therapy  Managed by: Weston Brass, PharmD Referring MD: Charlton Haws MD PCP: Dr. Charlton Haws Supervising MD: Juanda Chance MD, Bruce Indication 1: Atrial Fibrillation (ICD-427.31) Lab Used: [facility] [place] Site: [address] PT 22.1 INR POC 2.1 INR RANGE 2 - 3  Dietary changes: no    Health status changes: no    Bleeding/hemorrhagic complications: no    Recent/future hospitalizations: no    Any changes in medication regimen? no    Recent/future dental: no  Any missed doses?: no       Is patient compliant with meds? yes       Allergies: No Known Drug Allergies  Anticoagulation Management History:      Her anticoagulation is being managed by telephone today.  Negative risk factors for bleeding include an age less than 65 years old.  The bleeding index is 'low risk'.  Positive CHADS2 values include History of CHF and History of HTN.  Negative CHADS2 values include Age > 79 years old.  The start date was 09/15/2007.  Her last INR was 2.1 ratio.  Prothrombin time is 22.1.  Anticoagulation responsible provider: Juanda Chance MD, Smitty Cords.  INR POC: 2.1.  Exp: 02/2011.    Anticoagulation Management Assessment/Plan:      The patient's current anticoagulation dose is Warfarin sodium 5 mg tabs: Take as directed by Coumadin clinic.  The target INR is 2 - 3.  The next INR is due 03/14/2010.  Anticoagulation instructions were given to patient.  Results were reviewed/authorized by Weston Brass, PharmD.  She was notified by Weston Brass PharmD.        Coagulation management information includes: Eber Jones at [employer] number: [phone].  Prior Anticoagulation Instructions: INR 1.4  Spoke with pt and pt's mother.  Take extra 5mg  x 2 days then resume same dose of 1 tablet every day except 1/2 tablet on Monday, Wednesday and Friday.  Recheck INR in 10 days.  Orders faxed to [facility] with Loann Quill.  Current Anticoagulation Instructions: INR  2.1  Spoke with pt.  Conintue same dose of 1 tablet every day except 1/2 tablet on Monday, Wednesday and Friday.  Orders faxed to [place] at [facility].

## 2010-04-11 NOTE — Letter (Signed)
Summary: [facility] Office Visit Note   [facility] Office Visit Note   Imported By: Roderic Ovens 03/21/2010 16:35:08  _____________________________________________________________________  External Attachment:    Type:   Image     Comment:   External Document

## 2010-04-11 NOTE — Medication Information (Signed)
Summary: rov/sp  Anticoagulant Therapy  Managed by: Cloyde Reams, RN, BSN Referring MD: Charlton Haws MD PCP: Dr. Charlton Haws Supervising MD: Patty Sermons Indication 1: Atrial Fibrillation (ICD-427.31) Lab Used: [facility] [place] Site: [address] INR POC 1.5 INR RANGE 2 - 3  Dietary changes: no    Health status changes: no    Bleeding/hemorrhagic complications: no    Recent/future hospitalizations: no    Any changes in medication regimen? no    Recent/future dental: no  Any missed doses?: no       Is patient compliant with meds? yes       Allergies: No Known Drug Allergies  Anticoagulation Management History:      The patient is taking warfarin and comes in today for a routine follow up visit.  Negative risk factors for bleeding include an age less than 71 years old.  The bleeding index is 'low risk'.  Positive CHADS2 values include History of CHF and History of HTN.  Negative CHADS2 values include Age > 65 years old.  The start date was 09/15/2007.  Her last INR was 2.1 ratio.  Anticoagulation responsible provider: Jaymere Alen.  INR POC: 1.5.  Cuvette Lot#: [id number].  Exp: 02/2011.    Anticoagulation Management Assessment/Plan:      The patient's current anticoagulation dose is Warfarin sodium 5 mg tabs: Take as directed by Coumadin clinic.  The target INR is 2 - 3.  The next INR is due 04/05/2010.  Anticoagulation instructions were given to patient.  Results were reviewed/authorized by Cloyde Reams, RN, BSN.  She was notified by Cloyde Reams RN.         Prior Anticoagulation Instructions: INR 2.1  Spoke with pt.  Conintue same dose of 1 tablet every day except 1/2 tablet on Monday, Wednesday and Friday.  Orders faxed to [facility].   Current Anticoagulation Instructions: INR 1.5  Take 1 extra tablet today, then resume same dosage 1 tablet daily except 1/2 tablet on Mondays, Wednesdays and Fridays.  Recheck in 2 weeks.

## 2010-04-11 NOTE — Medication Information (Signed)
Summary: Lab Orders  Lab Orders   Imported By: Marylou Mccoy 03/07/2010 11:34:54  _____________________________________________________________________  External Attachment:    Type:   Image     Comment:   External Document

## 2010-04-11 NOTE — Miscellaneous (Signed)
Summary: Orders Update  Clinical Lists Changes  Orders: Added new Test order of TLB-PT (Protime) (85610-PTP) - Signed 

## 2010-04-12 NOTE — Consult Note (Signed)
Summary: [facility]   Imported By: Roderic Ovens 10/15/2009 12:19:36  _____________________________________________________________________  External Attachment:    Type:   Image     Comment:   External Document

## 2010-04-15 ENCOUNTER — Encounter: Payer: Self-pay | Admitting: Cardiology

## 2010-04-15 ENCOUNTER — Encounter (INDEPENDENT_AMBULATORY_CARE_PROVIDER_SITE_OTHER): Payer: Medicaid Other

## 2010-04-15 DIAGNOSIS — I4891 Unspecified atrial fibrillation: Secondary | ICD-10-CM

## 2010-04-15 DIAGNOSIS — Z7901 Long term (current) use of anticoagulants: Secondary | ICD-10-CM

## 2010-04-17 NOTE — Medication Information (Signed)
Summary: Med Orders  Med Orders   Imported By: Marylou Mccoy 04/10/2010 16:38:12  _____________________________________________________________________  External Attachment:    Type:   Image     Comment:   External Document

## 2010-04-25 NOTE — Medication Information (Signed)
Summary: [facility] Clinic  Anticoagulant Therapy  Managed by: Bethena Midget, RN, BSN Referring MD: Charlton Haws MD PCP: Dr. Charlton Haws Supervising MD: Jens Som MD, Arlys John Indication 1: Atrial Fibrillation (ICD-427.31) Lab Used: [facility] [place] Site: [address] INR POC 1.7 INR RANGE 2 - 3  Dietary changes: no    Health status changes: no    Bleeding/hemorrhagic complications: no    Recent/future hospitalizations: no    Any changes in medication regimen? no    Recent/future dental: no  Any missed doses?: no       Is patient compliant with meds? yes       Allergies: No Known Drug Allergies  Anticoagulation Management History:      The patient is taking warfarin and comes in today for a routine follow up visit.  Negative risk factors for bleeding include an age less than 36 years old.  The bleeding index is 'low risk'.  Positive CHADS2 values include History of CHF and History of HTN.  Negative CHADS2 values include Age > 64 years old.  The start date was 09/15/2007.  Her last INR was 2.1 ratio.  Anticoagulation responsible provider: Jens Som MD, Arlys John.  INR POC: 1.7.  Cuvette Lot#: [id number].  Exp: 03/2011.    Anticoagulation Management Assessment/Plan:      The patient's current anticoagulation dose is Warfarin sodium 5 mg tabs: Take as directed by Coumadin clinic.  The target INR is 2 - 3.  The next INR is due 04/29/2010.  Anticoagulation instructions were given to patient.  Results were reviewed/authorized by Bethena Midget, RN, BSN.  She was notified by Bethena Midget, RN, BSN.         Prior Anticoagulation Instructions: INR 1.5  Take 1 extra tablet today, then resume same dosage 1 tablet daily except 1/2 tablet on Mondays, Wednesdays and Fridays.  Recheck in 2 weeks.     Current Anticoagulation Instructions: INR 1.7 Today take extra 1/2 pill then resume 1 pill everyday except 1/2 pill on Mondays, Wednesdays and Fridays. Recheck in 2 weeks.

## 2010-04-26 ENCOUNTER — Encounter (HOSPITAL_COMMUNITY): Payer: Self-pay | Admitting: Physician Assistant

## 2010-04-29 ENCOUNTER — Encounter: Payer: Self-pay | Admitting: Cardiovascular Disease

## 2010-04-29 ENCOUNTER — Encounter (INDEPENDENT_AMBULATORY_CARE_PROVIDER_SITE_OTHER): Payer: Medicaid Other

## 2010-04-29 DIAGNOSIS — Z7901 Long term (current) use of anticoagulants: Secondary | ICD-10-CM

## 2010-04-29 DIAGNOSIS — I4891 Unspecified atrial fibrillation: Secondary | ICD-10-CM

## 2010-04-29 LAB — CONVERTED CEMR LAB: POC INR: 1.7

## 2010-05-07 NOTE — Medication Information (Signed)
Summary: rov/tm  Anticoagulant Therapy  Managed by: Bethena Midget, RN, BSN Referring MD: Charlton Haws MD PCP: Dr. Charlton Haws Supervising MD: Clifton James MD, Cristal Deer Indication 1: Atrial Fibrillation (ICD-427.31) Lab Used: [facility] [place] Site: [address] INR POC 1.7 INR RANGE 2 - 3  Dietary changes: no    Health status changes: no    Bleeding/hemorrhagic complications: no    Recent/future hospitalizations: no    Any changes in medication regimen? no    Recent/future dental: no  Any missed doses?: no       Is patient compliant with meds? yes       Allergies: No Known Drug Allergies  Anticoagulation Management History:      The patient is taking warfarin and comes in today for a routine follow up visit.  Negative risk factors for bleeding include an age less than 33 years old.  The bleeding index is 'low risk'.  Positive CHADS2 values include History of CHF and History of HTN.  Negative CHADS2 values include Age > 55 years old.  The start date was 09/15/2007.  Her last INR was 2.1 ratio.  Anticoagulation responsible provider: Clifton James MD, Cristal Deer.  INR POC: 1.7.  Cuvette Lot#: [id number].  Exp: 04/2011.    Anticoagulation Management Assessment/Plan:      The patient's current anticoagulation dose is Warfarin sodium 5 mg tabs: Take as directed by Coumadin clinic.  The target INR is 2 - 3.  The next INR is due 05/13/2010.  Anticoagulation instructions were given to patient.  Results were reviewed/authorized by Bethena Midget, RN, BSN.  She was notified by Bethena Midget, RN, BSN.         Prior Anticoagulation Instructions: INR 1.7 Today take extra 1/2 pill then resume 1 pill everyday except 1/2 pill on Mondays, Wednesdays and Fridays. Recheck in 2 weeks.   Current Anticoagulation Instructions: INR 1.7 Today take extra 1/2 pill then change dose to 1 pill everyday except 1/2 pill on Mondays and Fridays. REcheck in 2 weeks.

## 2010-05-10 ENCOUNTER — Encounter: Payer: Self-pay | Admitting: Cardiovascular Disease

## 2010-05-10 DIAGNOSIS — I4891 Unspecified atrial fibrillation: Secondary | ICD-10-CM

## 2010-05-13 ENCOUNTER — Encounter (INDEPENDENT_AMBULATORY_CARE_PROVIDER_SITE_OTHER): Payer: Medicaid Other

## 2010-05-13 ENCOUNTER — Encounter: Payer: Self-pay | Admitting: Cardiology

## 2010-05-13 DIAGNOSIS — Z7901 Long term (current) use of anticoagulants: Secondary | ICD-10-CM

## 2010-05-13 DIAGNOSIS — I4891 Unspecified atrial fibrillation: Secondary | ICD-10-CM

## 2010-05-21 NOTE — Medication Information (Signed)
Summary: rov/tm  Anticoagulant Therapy  Managed by: Windell Hummingbird, RN Referring MD: Charlton Haws MD PCP: Dr. Charlton Haws Supervising MD: Daleen Squibb MD, Maisie Fus Indication 1: Atrial Fibrillation (ICD-427.31) Lab Used: [facility] [place] Site: [address] INR POC 2.0 INR RANGE 2 - 3  Dietary changes: yes       Details: Didn't eat dark green leafy yesterday  Health status changes: no    Bleeding/hemorrhagic complications: no    Recent/future hospitalizations: no    Any changes in medication regimen? no    Recent/future dental: no  Any missed doses?: no       Is patient compliant with meds? yes       Allergies: No Known Drug Allergies  Anticoagulation Management History:      The patient is taking warfarin and comes in today for a routine follow up visit.  Negative risk factors for bleeding include an age less than 24 years old.  The bleeding index is 'low risk'.  Positive CHADS2 values include History of CHF and History of HTN.  Negative CHADS2 values include Age > 48 years old.  The start date was 09/15/2007.  Her last INR was 2.1 ratio.  Anticoagulation responsible provider: Daleen Squibb MD, Maisie Fus.  INR POC: 2.0.  Cuvette Lot#: [id number].  Exp: 04/2011.    Anticoagulation Management Assessment/Plan:      The patient's current anticoagulation dose is Warfarin sodium 5 mg tabs: Take as directed by Coumadin clinic.  The target INR is 2 - 3.  The next INR is due 06/03/2010.  Anticoagulation instructions were given to patient.  Results were reviewed/authorized by Windell Hummingbird, RN.  She was notified by Windell Hummingbird, RN.         Prior Anticoagulation Instructions: INR 1.7 Today take extra 1/2 pill then change dose to 1 pill everyday except 1/2 pill on Mondays and Fridays. REcheck in 2 weeks.   Current Anticoagulation Instructions: INR 2.0 Continue taking 1 tablet every day, except take 1/2 tablet on Mondays and Fridays. Recheck in 3 weeks.

## 2010-05-25 LAB — CBC
HCT: 32 % — ABNORMAL LOW (ref 36.0–46.0)
HCT: 34.8 % — ABNORMAL LOW (ref 36.0–46.0)
HCT: 36.8 % (ref 36.0–46.0)
HCT: 37.3 % (ref 36.0–46.0)
HCT: 38.8 % (ref 36.0–46.0)
Hemoglobin: 12.7 g/dL (ref 12.0–15.0)
Hemoglobin: 13 g/dL (ref 12.0–15.0)
Hemoglobin: 13.3 g/dL (ref 12.0–15.0)
Hemoglobin: 9.6 g/dL — ABNORMAL LOW (ref 12.0–15.0)
MCH: 29.2 pg (ref 26.0–34.0)
MCH: 29.6 pg (ref 26.0–34.0)
MCH: 31.7 pg (ref 26.0–34.0)
MCH: 34.7 pg — ABNORMAL HIGH (ref 26.0–34.0)
MCHC: 32.8 g/dL (ref 30.0–36.0)
MCHC: 34.9 g/dL (ref 30.0–36.0)
MCHC: 35.2 g/dL (ref 30.0–36.0)
MCHC: 35.2 g/dL (ref 30.0–36.0)
MCHC: 35.7 g/dL (ref 30.0–36.0)
MCHC: 35.8 g/dL (ref 30.0–36.0)
MCHC: 36.6 g/dL — ABNORMAL HIGH (ref 30.0–36.0)
MCV: 89.1 fL (ref 78.0–100.0)
MCV: 90.3 fL (ref 78.0–100.0)
MCV: 92.8 fL (ref 78.0–100.0)
MCV: 93.2 fL (ref 78.0–100.0)
MCV: 96.4 fL (ref 78.0–100.0)
MCV: 96.6 fL (ref 78.0–100.0)
Platelets: 102 10*3/uL — ABNORMAL LOW (ref 150–400)
Platelets: 109 10*3/uL — ABNORMAL LOW (ref 150–400)
Platelets: 123 10*3/uL — ABNORMAL LOW (ref 150–400)
Platelets: 127 10*3/uL — ABNORMAL LOW (ref 150–400)
Platelets: 143 10*3/uL — ABNORMAL LOW (ref 150–400)
Platelets: 97 10*3/uL — ABNORMAL LOW (ref 150–400)
RBC: 3.27 MIL/uL — ABNORMAL LOW (ref 3.87–5.11)
RBC: 3.41 MIL/uL — ABNORMAL LOW (ref 3.87–5.11)
RBC: 4.19 MIL/uL (ref 3.87–5.11)
RBC: 4.29 MIL/uL (ref 3.87–5.11)
RDW: 16.7 % — ABNORMAL HIGH (ref 11.5–15.5)
RDW: 17 % — ABNORMAL HIGH (ref 11.5–15.5)
RDW: 17.3 % — ABNORMAL HIGH (ref 11.5–15.5)
RDW: 17.5 % — ABNORMAL HIGH (ref 11.5–15.5)
RDW: 17.8 % — ABNORMAL HIGH (ref 11.5–15.5)
WBC: 5.8 10*3/uL (ref 4.0–10.5)
WBC: 6.2 10*3/uL (ref 4.0–10.5)
WBC: 8.1 10*3/uL (ref 4.0–10.5)

## 2010-05-25 LAB — BASIC METABOLIC PANEL
BUN: 12 mg/dL (ref 6–23)
BUN: 13 mg/dL (ref 6–23)
BUN: 13 mg/dL (ref 6–23)
BUN: 16 mg/dL (ref 6–23)
CO2: 22 mEq/L (ref 19–32)
CO2: 22 mEq/L (ref 19–32)
CO2: 24 mEq/L (ref 19–32)
CO2: 26 mEq/L (ref 19–32)
CO2: 27 mEq/L (ref 19–32)
Calcium: 8.3 mg/dL — ABNORMAL LOW (ref 8.4–10.5)
Calcium: 8.4 mg/dL (ref 8.4–10.5)
Calcium: 8.7 mg/dL (ref 8.4–10.5)
Calcium: 8.7 mg/dL (ref 8.4–10.5)
Chloride: 105 mEq/L (ref 96–112)
Chloride: 106 mEq/L (ref 96–112)
Chloride: 109 mEq/L (ref 96–112)
Creatinine, Ser: 0.88 mg/dL (ref 0.4–1.2)
Creatinine, Ser: 0.91 mg/dL (ref 0.4–1.2)
Creatinine, Ser: 1.06 mg/dL (ref 0.4–1.2)
Creatinine, Ser: 1.07 mg/dL (ref 0.4–1.2)
Creatinine, Ser: 1.12 mg/dL (ref 0.4–1.2)
GFR calc Af Amer: >60 mL/min (ref >60)
GFR calc Af Amer: >60 mL/min (ref >60)
GFR calc Af Amer: >60 mL/min (ref >60)
GFR calc non Af Amer: 51 mL/min — ABNORMAL LOW (ref >60)
GFR calc non Af Amer: 54 mL/min — ABNORMAL LOW (ref >60)
GFR calc non Af Amer: >60 mL/min (ref >60)
GFR calc non Af Amer: >60 mL/min (ref >60)
GFR calc non Af Amer: >60 mL/min (ref >60)
Glucose, Bld: 71 mg/dL (ref 70–99)
Glucose, Bld: 79 mg/dL (ref 70–99)
Glucose, Bld: 81 mg/dL (ref 70–99)
Glucose, Bld: 85 mg/dL (ref 70–99)
Glucose, Bld: 90 mg/dL (ref 70–99)
Potassium: 4.1 mEq/L (ref 3.5–5.1)
Potassium: 4.7 mEq/L (ref 3.5–5.1)
Potassium: 4.8 mEq/L (ref 3.5–5.1)
Potassium: 4.9 mEq/L (ref 3.5–5.1)
Sodium: 129 mEq/L — ABNORMAL LOW (ref 135–145)
Sodium: 137 mEq/L (ref 135–145)
Sodium: 138 mEq/L (ref 135–145)
Sodium: 141 mEq/L (ref 135–145)

## 2010-05-25 LAB — COMPREHENSIVE METABOLIC PANEL
ALT: 12 U/L (ref 0–35)
AST: 32 U/L (ref 0–37)
Alkaline Phosphatase: 45 U/L (ref 39–117)
CO2: 20 mEq/L (ref 19–32)
Chloride: 105 mEq/L (ref 96–112)
GFR calc Af Amer: 36 mL/min — ABNORMAL LOW (ref >60)
GFR calc non Af Amer: 30 mL/min — ABNORMAL LOW (ref >60)
Glucose, Bld: 167 mg/dL — ABNORMAL HIGH (ref 70–99)
Potassium: 4.5 mEq/L (ref 3.5–5.1)
Sodium: 139 mEq/L (ref 135–145)

## 2010-05-25 LAB — URINE CULTURE

## 2010-05-25 LAB — RENAL FUNCTION PANEL
Albumin: 2.7 g/dL — ABNORMAL LOW (ref 3.5–5.2)
BUN: 19 mg/dL (ref 6–23)
Calcium: 8.6 mg/dL (ref 8.4–10.5)
Chloride: 103 mEq/L (ref 96–112)
Creatinine, Ser: 1.23 mg/dL — ABNORMAL HIGH (ref 0.4–1.2)
GFR calc Af Amer: 55 mL/min — ABNORMAL LOW (ref >60)

## 2010-05-25 LAB — PROTIME-INR
INR: 1.83 — ABNORMAL HIGH (ref 0.00–1.49)
INR: 1.86 — ABNORMAL HIGH (ref 0.00–1.49)
INR: 1.95 — ABNORMAL HIGH (ref 0.00–1.49)
INR: 2.08 — ABNORMAL HIGH (ref 0.00–1.49)
INR: 2.97 — ABNORMAL HIGH (ref 0.00–1.49)
INR: 3.14 — ABNORMAL HIGH (ref 0.00–1.49)
INR: 5.28 (ref 0.00–1.49)
Prothrombin Time: 19.9 seconds — ABNORMAL HIGH (ref 11.6–15.2)
Prothrombin Time: 21 seconds — ABNORMAL HIGH (ref 11.6–15.2)
Prothrombin Time: 21.2 seconds — ABNORMAL HIGH (ref 11.6–15.2)
Prothrombin Time: 22.1 seconds — ABNORMAL HIGH (ref 11.6–15.2)
Prothrombin Time: 24.8 seconds — ABNORMAL HIGH (ref 11.6–15.2)
Prothrombin Time: 29.6 seconds — ABNORMAL HIGH (ref 11.6–15.2)
Prothrombin Time: 30.5 seconds — ABNORMAL HIGH (ref 11.6–15.2)

## 2010-05-25 LAB — URINALYSIS, MICROSCOPIC ONLY
Glucose, UA: NEGATIVE mg/dL
Protein, ur: NEGATIVE mg/dL
Specific Gravity, Urine: 1.012 (ref 1.005–1.030)

## 2010-05-25 LAB — CARDIAC PANEL(CRET KIN+CKTOT+MB+TROPI)
CK, MB: 1.2 ng/mL (ref 0.3–4.0)
Relative Index: 0.5 (ref 0.0–2.5)
Total CK: 231 U/L — ABNORMAL HIGH (ref 7–177)
Troponin I: 0.01 ng/mL (ref 0.00–0.06)

## 2010-05-25 LAB — TSH: TSH: 0.234 u[IU]/mL — ABNORMAL LOW (ref 0.350–4.500)

## 2010-05-26 LAB — CBC
HCT: 37.1 % (ref 36.0–46.0)
Hemoglobin: 12.9 g/dL (ref 12.0–15.0)
MCV: 92.9 fL (ref 78.0–100.0)
RBC: 4 MIL/uL (ref 3.87–5.11)
WBC: 5.3 10*3/uL (ref 4.0–10.5)

## 2010-05-26 LAB — URINALYSIS, ROUTINE W REFLEX MICROSCOPIC
Glucose, UA: NEGATIVE mg/dL
Ketones, ur: NEGATIVE mg/dL
Nitrite: NEGATIVE
Specific Gravity, Urine: 1.008 (ref 1.005–1.030)
pH: 6 (ref 5.0–8.0)

## 2010-05-26 LAB — DIFFERENTIAL
Basophils Absolute: 0 10*3/uL (ref 0.0–0.1)
Eosinophils Relative: 0 % (ref 0–5)
Lymphocytes Relative: 28 % (ref 12–46)
Lymphs Abs: 1.4 10*3/uL (ref 0.7–4.0)
Monocytes Absolute: 0.6 10*3/uL (ref 0.1–1.0)
Monocytes Relative: 12 % (ref 3–12)
Neutro Abs: 3 10*3/uL (ref 1.7–7.7)

## 2010-05-26 LAB — TSH: TSH: 0.049 u[IU]/mL — ABNORMAL LOW (ref 0.350–4.500)

## 2010-05-26 LAB — POCT CARDIAC MARKERS
CKMB, poc: <1 ng/mL — ABNORMAL LOW (ref 1.0–8.0)
Myoglobin, poc: 147 ng/mL (ref 12–200)
Troponin i, poc: <0.05 ng/mL (ref 0.00–0.09)

## 2010-05-26 LAB — APTT: aPTT: 39 seconds — ABNORMAL HIGH (ref 24–37)

## 2010-05-26 LAB — TROPONIN I: Troponin I: 0.01 ng/mL (ref 0.00–0.06)

## 2010-05-26 LAB — BASIC METABOLIC PANEL
BUN: 26 mg/dL — ABNORMAL HIGH (ref 6–23)
Chloride: 102 mEq/L (ref 96–112)
GFR calc non Af Amer: 28 mL/min — ABNORMAL LOW (ref >60)
Potassium: 4.3 mEq/L (ref 3.5–5.1)
Sodium: 137 mEq/L (ref 135–145)

## 2010-05-26 LAB — CK TOTAL AND CKMB (NOT AT ARMC): Relative Index: 0.7 (ref 0.0–2.5)

## 2010-05-26 LAB — VALPROIC ACID LEVEL: Valproic Acid Lvl: 87.5 ug/mL (ref 50.0–100.0)

## 2010-05-26 LAB — MRSA PCR SCREENING: MRSA by PCR: NEGATIVE

## 2010-05-26 LAB — BRAIN NATRIURETIC PEPTIDE: Pro B Natriuretic peptide (BNP): 1085 pg/mL — ABNORMAL HIGH (ref 0.0–100.0)

## 2010-06-10 ENCOUNTER — Ambulatory Visit (INDEPENDENT_AMBULATORY_CARE_PROVIDER_SITE_OTHER): Payer: Medicaid Other | Admitting: *Deleted

## 2010-06-10 DIAGNOSIS — Z7901 Long term (current) use of anticoagulants: Secondary | ICD-10-CM | POA: Insufficient documentation

## 2010-06-10 DIAGNOSIS — I4891 Unspecified atrial fibrillation: Secondary | ICD-10-CM

## 2010-06-10 NOTE — Patient Instructions (Signed)
Take an extra 1/2 tablet of Coumadin today, then resume same dosage 1 tablet daily except 1/2 tablet on Mondays and Fridays.  Recheck in 3 weeks.

## 2010-06-18 ENCOUNTER — Other Ambulatory Visit: Payer: Self-pay | Admitting: *Deleted

## 2010-06-18 MED ORDER — FUROSEMIDE 40 MG PO TABS: 40.0000 mg | ORAL_TABLET | Freq: Two times a day (BID) | ORAL | Status: DC

## 2010-06-19 ENCOUNTER — Other Ambulatory Visit: Payer: Self-pay | Admitting: *Deleted

## 2010-06-19 LAB — DIFFERENTIAL
Basophils Relative: 0 % (ref 0–1)
Eosinophils Absolute: 0 10*3/uL (ref 0.0–0.7)
Eosinophils Relative: 0 % (ref 0–5)
Monocytes Absolute: 0.5 10*3/uL (ref 0.1–1.0)
Monocytes Relative: 8 % (ref 3–12)
Neutrophils Relative %: 69 % (ref 43–77)

## 2010-06-19 LAB — COMPREHENSIVE METABOLIC PANEL
ALT: 22 U/L (ref 0–35)
Alkaline Phosphatase: 49 U/L (ref 39–117)
Glucose, Bld: 107 mg/dL — ABNORMAL HIGH (ref 70–99)
Potassium: 4.1 mEq/L (ref 3.5–5.1)
Sodium: 142 mEq/L (ref 135–145)
Total Protein: 6.6 g/dL (ref 6.0–8.3)

## 2010-06-19 LAB — ETHANOL: Alcohol, Ethyl (B): <5 mg/dL (ref 0–10)

## 2010-06-19 LAB — URINE MICROSCOPIC-ADD ON

## 2010-06-19 LAB — CBC
Hemoglobin: 13.9 g/dL (ref 12.0–15.0)
RBC: 4.64 MIL/uL (ref 3.87–5.11)
RDW: 16.8 % — ABNORMAL HIGH (ref 11.5–15.5)

## 2010-06-19 LAB — URINALYSIS, ROUTINE W REFLEX MICROSCOPIC
Hgb urine dipstick: NEGATIVE
Protein, ur: NEGATIVE mg/dL
Urobilinogen, UA: 2 mg/dL — ABNORMAL HIGH (ref 0.0–1.0)

## 2010-06-19 LAB — PROTIME-INR: INR: 1.9 — ABNORMAL HIGH (ref 0.00–1.49)

## 2010-07-01 ENCOUNTER — Encounter: Payer: Medicaid Other | Admitting: *Deleted

## 2010-07-05 ENCOUNTER — Ambulatory Visit (INDEPENDENT_AMBULATORY_CARE_PROVIDER_SITE_OTHER): Payer: Medicaid Other | Admitting: *Deleted

## 2010-07-05 DIAGNOSIS — I4891 Unspecified atrial fibrillation: Secondary | ICD-10-CM

## 2010-07-10 ENCOUNTER — Other Ambulatory Visit: Payer: Self-pay | Admitting: Cardiovascular Disease

## 2010-07-11 ENCOUNTER — Other Ambulatory Visit: Payer: Self-pay | Admitting: *Deleted

## 2010-07-11 MED ORDER — WARFARIN SODIUM 5 MG PO TABS: 5.0000 mg | ORAL_TABLET | ORAL | Status: DC

## 2010-07-19 ENCOUNTER — Encounter (INDEPENDENT_AMBULATORY_CARE_PROVIDER_SITE_OTHER): Payer: Medicaid Other | Admitting: *Deleted

## 2010-07-19 DIAGNOSIS — I4891 Unspecified atrial fibrillation: Secondary | ICD-10-CM

## 2010-07-23 NOTE — Discharge Summary (Signed)
[name], Mahoney              ACCOUNT NO.:  [id number]   MEDICAL RECORD NO.:  [id number]          PATIENT TYPE:  INP   LOCATION:  2032                         FACILITY:  [facility]   PHYSICIAN:  Doylene Canning. Ladona Ridgel, MD    DATE OF BIRTH:  1955/04/27   DATE OF ADMISSION:  09/14/2007  DATE OF DISCHARGE:  09/20/2007                               DISCHARGE SUMMARY   FINAL DIAGNOSES:  1. Admitted with shortness of breath/palpitation/and wheezing.  2. New of diagnosis of diastolic congestive heart failure.  This      admission with ejection fraction of 30%-40%/global hypokinesis.  3. Admission rhythm is atrial fibrillation with rapid ventricular      rate.      a.     Very difficult to achieve rate control secondary to tendency       to severe hypotension on medication.      b.     Failed Direct current cardioversion September 15, 2007.      c.     Started amiodarone September 16, 2007, with spontaneous conversion       of sinus rhythm after 72-hour load on September 18, 2007.  4. Volume overload requiring aggressive diuresis, this admission.   SECONDARY DIAGNOSES:  1. Bipolar disorder.  2. Urinary frequency restarted on Detrol, this admission.  3. Ongoing tobacco habituation.   PROCEDURE:  September 16, 2007, transesophageal echocardiogram.  The EF is 30-  40% with global hypokinesis, mild mitral regurgitation.  No left atrial  appendage clot.   PLAN:  1. Coumadin clinic, September 22, 2007 at 2:30.  2. See Dr. Eden Emms, October 04, 2007 at 12:15.  3. Possible outpatient Myoview versus followup echocardiogram.   BRIEF HISTORY:  Abigail Mahoney is a 55 year old female.  Her past medical  history includes bipolar disorder.  She presented to the [facility]  Emergency Room with acute onset of shortness of breath and palpitation.  She says her symptoms began on September 11, 2007, when she noticed some  shortness of breath and this continued over the next 2 days.   Her sister noticed that she was wheezing and took her to her  primary  care physician's office.  The patient was treated with albuterol and  given an inhaler to take home over the course of Monday, September 13, 2007.  Her shortness of breath and wheezing got much worse.  She fell as if  heart was racing.   The patient presented to the emergency room.  She was discovered to be  in atrial fibrillation with rapid ventricular rate.  She denies chest  pain, syncope, and presyncope.  She does not have lower extremity edema.  No paroxysmal nocturnal dyspnea or orthopnea.  She states that prior to  Saturday, September 11, 2007, she was in her usual state of health and enjoys  an active lifestyle.  She denies any fevers.  No excessive diarrhea.  She does have a history of urinary frequency per the patient's family  and had been on the Detrol in the past.   HOSPITAL COURSE:  The patient is admitted to [facility] from  [facility] Emergency Room with atrial fibrillation with rapid  ventricular rate, troponin I studies are negative 0.01, and less than  0.02.  The BNP on admission on September 14, 2007 was 691.  The patient proved  difficult for rate control.  She had no electrophysiology consult for  possible radiofrequency catheter ablation of work and was presumed to be  atrial flutter, over-riding her atrial fibrillation but as her  hospitalization continued, it was obvious that she was locked into  atrial fibrillation, rapid ventricular rate, with any additional rate  control medications in 10, and the patient would become hypotensive  requiring with the all of rate control medications and sprinting of her  heart rate into the 130s.  On September 16, 2007, she had a transesophageal  echocardiogram, which showed no left atrial appendage clot and an  ejection fraction of 30%-40%.  She was started on amiodarone after this  was unsuccessful it to converting to sinus rhythm and loaded on this for  the next 4 days.  Once again during this time, rate control was  exceedingly  difficult.  She was tried on regimens of IV Cardizem and  Lopressor with hypotension, then IV Cardizem and digoxin also with  severe hypotension.  She finally converted to sinus rhythm in  paroxysmal fashion alternating sinus rhythm and atrial fibrillation  rapid rates, which were asymptomatic on Monday, September 20, 2007.  It was  decided that the patient will go home that day to have a close followup.  She was started on Coumadin during this hospitalization after  transesophageal echocardiogram showed no left atrial appendage thrombus  and continues on this as an outpatient.   DISCHARGE MEDICATIONS:  Include:  1. Metoprolol succinate 50 mg tablets 1-1/2 tabs daily. This is new      medication.  2. Amiodarone 200 mg tablets, 2 tablets daily, also new.  3. Furosemide 40 mg twice daily also new.  4. Potassium chloride  20 mEq in the morning 10 mEq in the evening      also new.  5. Coumadin 2.5 mg daily, was directed by the Coumadin clinic.  6. Detrol 4 mg daily.  7. Depakote 500 mg twice daily.  8. Risperdal 2 mg daily at bedtime.   FOLLOWUP:  She follows at [facility].  The Coumadin Clinic  Wednesday, September 22, 2007 at 2:30.  She sees Dr. Eden Emms, on Monday, October 04, 2007 at 12: 15.   LABORATORY STUDIES:  Other labs not mentioned in the body of this  dictation, hemoglobin 13.2, hematocrit 38.4, white cells 8.5, and  platelets are 191 on September 19, 2007.  Serum electrolytes on the of  discharge September 20, 2007, sodium is 139, potassium 4.5, chloride 101,  bicarbonate 29, BUN is 13, creatinine 1.01, and glucose 79.  Protime on  the day of discharge 24.7, INR 2.1, alkaline phosphatase this admission  is 47, SGOT 29, SGPT is 45.  Urinalysis was negative.      Maple Mirza, PA      Doylene Canning. Ladona Ridgel, MD  Electronically Signed    GM/MEDQ  D:  09/20/2007  T:  09/21/2007  Job:  [id number]   cc:   Jocelyn Lamer D. Pecola Leisure, M.D.  Noralyn Pick. Eden Emms, MD, [facility]

## 2010-07-23 NOTE — Assessment & Plan Note (Signed)
[facility]   NAMEZEKIAH, COEN              ACCOUNT NO.:  [id number]   MEDICAL RECORD NO.:  [id number]      DATE OF BIRTH:  Jun 23, 1955   PHYSICIAN:  Noralyn Pick. Eden Emms, MD, [facility]    VISIT DATE:                                   OFFICE VISIT   INDICATIONS:  Rapid atrial fibrillation with congestive heart failure.   The patient was sedated with 6 mg of Versed, 100 mcg of fentanyl.   Using digital technique an Omniplane probe was advanced of the esophagus  without incident.   Transgastric imaging revealed moderate left ventricular hypertrophy and  enlargement of hypokinesis.  EF was 30-40%.  There was no mural apical  thrombus.  Mitral valve was structurally normal with mild MR.  There was  moderate left atrial enlargement and mild right atrial enlargement.  Aortic valve was trileaflet and sclerotic with trivial AI.  There was  mild MR.  Right-sided cardiac chambers were normal.  The left atrial  appendage was moderately dilated.  There was no spontaneous contrast or  formed thrombus.  There was no ASD.   Since the patient had left atrial appendage clot and had good sedation,  we proceeded with direct current cardioversion.  She was shocked with  200 joules in a biphasic fashion.   The patient converted to sinus rhythm for 5-6 beats on 2 of the 3 shock;  however, she reverted to AFib.   IMPRESSION:  CT-guided cardioversion with unsuccessful attempt at  conversion.  The patient will be loaded with amiodarone and subsequent  cardioversion tried once her INR is therapeutic.      Noralyn Pick. Eden Emms, MD, [facility]  Electronically Signed     PCN/MEDQ  D:  09/15/2007  T:  09/16/2007  Job:  [id number]

## 2010-07-23 NOTE — Assessment & Plan Note (Signed)
[facility]                            CARDIOLOGY OFFICE NOTE   CHEY, RACHELS                     MRN:          [id number]  DATE:12/20/2007                            DOB:          [birth year]    Abigail Mahoney returns today for followup.  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  In July, her EF was 20-30%.  She is status post cardioversion.  Her initial cardioversion failed, but she converts chemically with  amiodarone.  She needs followup lab work or amiodarone.  She is not  having palpitations, PND, orthopnea.  Her breathing has improved.  She  has trace lower extremity edema.  She continues to be active.   We have kept her on Coumadin due to difficulty with her conversion.   Her review of systems otherwise negative.   She is on divalproex 500 b.i.d., Risperdal 2 mg nightly for bipolar  disease, Detrol 4 a day, Coumadin as directed, potassium 20 in the  morning, 10 at night, Lasix 40 b.i.d., metoprolol 25 in the morning, and  amiodarone 400 a day.   PHYSICAL EXAMINATION:  GENERAL:  Remarkable for an overweight black  female in no distress.  VITAL SIGNS:  Weight is 238, blood pressure 136/82, pulse 64 and  regular, respiratory rate 14, afebrile.  HEENT:  Unremarkable except for squid vision with right eye deviating  outwards, no lymphadenopathy, thyromegaly, JVP elevation.  LUNGS:  Clear.  Good diaphragmatic motion.  No wheezing.  HEART:  S1 and S2, distant heart sounds.  PMI not palpable.  ABDOMEN:  Benign.  Bowel sounds positive.  No AAA, no tenderness, no  bruit, no hepatosplenomegaly, no hepatojugular reflux.  no tenderness.  EXTREMITIES:  Distal pulses are intact.  No edema.  NEURO:  Nonfocal.  SKIN:  Warm and dry.  No muscle weakness.   IMPRESSION:  1. Cardiomyopathy.  Recheck left ventricular function.  Continue beta-      blocker.  Consider adding ACE inhibitor if left ventricular      function still poor.  2. Atrial  fibrillation.  Continue Coumadin and beta-blocker,      maintaining sinus rhythm, and weaned amiodarone down to 200 mg a      day and 6 months.  Check PFTs, thyroid, liver, as well as lung      function on amiodarone.  3. Bipolar disease, stable.  Continue divalproex and Risperdal.  4. Anticoagulation.  Followup in clinic.  No bleeding diathesis.  INRs      have been stable.    Noralyn Pick. Eden Emms, MD, [facility]  Electronically Signed   PCN/MedQ  DD: 12/20/2007  DT: 12/21/2007  Job #: [id number]

## 2010-07-23 NOTE — H&P (Signed)
Abigail Mahoney, Abigail Mahoney              ACCOUNT NO.:  [id number]   MEDICAL RECORD NO.:  [id number]          PATIENT TYPE:  INP   LOCATION:  2909                         FACILITY:  [facility]   PHYSICIAN:  Wendi Snipes, MD DATE OF BIRTH:  [birth year]   DATE OF ADMISSION:  09/14/2007  DATE OF DISCHARGE:                              HISTORY & PHYSICAL   CHIEF COMPLAINT:  Shortness of breath and wheezing.   PRESENT ILLNESS:  This a 55 year old [place] American female with a past  medical history significant for bipolar disorder who presents to [facility] ED with acute onset of shortness of breath and palpitations.  She  states her symptoms began around Saturday when she noticed some  shortness of breath which progressed through Sunday and Monday morning.  Her sister noticed that she was wheezing and decided to assist her to  her primary care physician's office, Dr. Haskel Schroeder office.  There she was  noticed to be wheezing and was treated with albuterol.  After a few  doses of albuterol over the course of Monday, she noticed that her  shortness of breath and wheezing got much, much worse, and she felt like  her heart was racing.  She subsequently decided that it was time to be  evaluated in emergency department.  There she was discovered be in  atrial fibrillation with a rapid ventricular response and stable  otherwise.  She denies any chest pain, syncope, presyncope, increased  lower extremity edema, paroxysmal nocturnal dyspnea, orthopnea.  She  stated prior to Saturday she was in usual state of health and enjoys  quite an active lifestyle.  She also denies any fevers; however,  describes feeling hot and cold intolerance during the course of Sunday  night.  She also denies any excessive diarrhea, urinary frequency, or  other constitutional symptoms suggesting systemic illness.   PAST MEDICAL HISTORY:  1. Bipolar disorder.  2. Tobacco abuse.   MEDICATIONS ON ADMISSION:  1. Avapro X 500 mg  twice daily.  2. Albuterol 9 mcg as needed.  3. Risperdal 2 mg at bedtime time.   ALLERGIES:  No known drug allergies.   FAMILY HISTORY:  Significant for hypertension and diabetes on both  mother's and father's side.   SOCIAL HISTORY:  She lives by herself.  She is currently unemployed, and  she smokes about a pack of cigarettes per day.   REVIEW OF SYSTEMS:  All 14 systems are reviewed and are negative except  as mentioned and detailed in the HPI.   PHYSICAL EXAM:  VITAL SIGNS:  Blood pressure was 133/72, pulse was 116  and irregular.  Breathing is 22 breaths per minute.  She is afebrile,  saturating 96% on 2 liters nasal cannula.  GENERALLY:  She is 55 year old African American female in mild acute  distress.  HEENT: Moist mucous membranes.  Pupils equal, round, react to light and  accommodation.  Anicteric sclera.  No proptosis noted.  NECK:  An 8-10 cm jugular venous distention.  No thyromegaly.  HEART: Irregularly irregular, tachycardiac.  No murmurs, rubs, or  gallops.  LUNGS: Diffuse wheezes throughout  lung fields.  ABDOMEN:  Nontender, nondistended.  Positive bowel sounds.  Pulses 2+  pulses throughout.  EXTREMITIES:  No clubbing, cyanosis, or edema.  SKIN:  Warm, dry, and intact.  No rashes.  PSYCHIATRIC:  Pressured speech.  Mood is normal.  NEUROLOGIC:  Cranial nerves II-XII grossly intact.  Alert and oriented  x3.  No focal neurologic deficits.   LABS:  Creatinine was 0.8, troponin was negative.  White count was not  elevated.  Her hemoglobin was 12.  Thyroid panel was not checked at the  outside hospital.  EKG shows atrial fibrillation at 189 beats per  minute.  Atrial fibrillation with nonspecific ST-and-T-wave  abnormalities.   ASSESSMENT/PLAN:  1. New onset atrial fibrillation.  It is not clear how long she has      been in her irregular rhythm.  However, judging by her symptoms;      this may have been going on for greater than 72 hours and      exacerbated  by her beta-agonist from wheezing.  Will start a      heparin drip.  At this point she has received Cardizem and digoxin      at the outside hospital, and was started on a Esmolol drip for rate      control.  Plan to pursue at cardioversion in the morning after a      Transesophageal echo.  She will also need a surface echocardiogram      to check for structural heart disease.  We will check a thyroid      panel and look for any other secondary causes of atrial      fibrillation.  She is currently hemodynamically stable.  2. Wheezing.  Will try ectropium nebulizer treatment, and hold off on      any more beta agonists as they seem to exacerbate her symptoms.      This is all likely secondary to her atrial fibrillation but will      look for any underlying pulmonary conditions, repeat a chest x-ray.      She is currently saturating well and moving good air.  Will      consider steroid therapy for asthma exacerbation as she progresses.      Wendi Snipes, MD  Electronically Signed     BHH/MEDQ  D:  09/14/2007  T:  09/14/2007  Job:  [id number]

## 2010-07-23 NOTE — Assessment & Plan Note (Signed)
[facility]                            CARDIOLOGY OFFICE NOTE   MAYAR, WHITTIER                     MRN:          [id number]  DATE:10/04/2007                            DOB:          09/30/1955    She returns today for followup.  She is a consult that I saw in the  hospital.   The patient was admitted with increasing shortness of breath,  cardiomyopathy with an EF of 30-40%, and rapid atrial fibrillation.  Her  BNP was in excess of 600.  I tried an initial attempt at TEE  cardioversion, which failed.  She was subsequently started on amiodarone  and spontaneously converted to sinus rhythm.   She was discharged on Coumadin.  The patient has significant bipolar  disease and is mentally challenged; however, she has done well since  hospital discharge.  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  Since discharge, she has had less shortness of  breath.  No chest pain, no PND or orthopnea, no palpitations.  She  appears to be maintaining sinus rhythm.   Her sister was with her today.  I told her that the initial game plan  would be for the patient to have a repeat assessment of her LV function  in 3 months.  If she is maintaining sinus rhythm and her LV function is  improved,  we would consider cutting her amiodarone back from 400-200  and possibly stopping her Coumadin after 6 months.   Her review of systems is, otherwise, negative with a INR today was  little high at 3.2.  Adjustments were properly made.  Her thyroid  studies were normal in the hospital with a free T4 of 1.51 and a TSH of  1.8.   Her current medications include:  1. Divalproex 500 b.i.d.  2. Risperdal 2 mg a day.  3. Detrol 4 mg a day.  4. Coumadin as directed.  5. Potassium 20 in the morning, 20 at night.  6. Lasix 40 b.i.d.  7. Metoprolol 25 a day.  8. Amiodarone 400 a day.   GENERAL:  Exam is remarkable for an overweight black female,  in no  distress.  She is quite excitable.  VITAL SIGNS:  Blood pressure 130/70, pulse is 72 and regular,  respiratory rate 14 and afebrile.  HEENT:  Unremarkable.  Carotids are without bruit.  No lymphadenopathy.  No JVP elevation.  LUNGS:  Clear, good diaphragmatic motion.  No wheezing.  HEART:  She still has some marks on her front and back from her  cardioversions.  S1 and S2.  Heart sounds distant.  PMI not palpable.  ABDOMEN:  Protuberant.  Bowel sounds positive.  No AAA, no tenderness,  no bruit, or no hepatosplenomegaly.  No hepatojugular reflux.  Distal pulses intact.  No edema.  NEURO:  Nonfocal.  SKIN:  Warm and dry.  No muscular weakness.   EKG shows sinus rhythm with poor R wave progression, occasional PAC.   IMPRESSION:  1. Atrial fibrillation status post cardioversion.  Continue      amiodarone,  low-dose beta-blocker, and anticoagulation.  2. Cardiomyopathy, likely tachycardia mediated versus nonischemic.      Followup echo in 3 months to reassess LV function.  3. Hypertension, currently well controlled.  Continue current dose of      diuretic and metoprolol.  4. History of bipolar disease.  Continue Risperdal, divalproex and      follow up with primary care MD.   Overall, I am pleased that she seems to be doing well, and hopefully,  her LV function will be improved on next evaluation.     Noralyn Pick. Eden Emms, MD, [facility]  Electronically Signed    PCN/MedQ  DD: 10/04/2007  DT: 10/05/2007  Job #: [id number]

## 2010-07-26 NOTE — Discharge Summary (Signed)
NAMEMADHURI, Abigail Mahoney                        ACCOUNT NO.:  [id number]   MEDICAL RECORD NO.:  [id number]                   PATIENT TYPE:  IPS   LOCATION:  0404                                 [facility]ILITY[facility]   PHYSICIAN:  Carolanne Grumbling, M.D.                 DATE OF BIRTH:  22-Aug-1955   DATE OF ADMISSION:  09/05/2002  DATE OF DISCHARGE:  09/14/2002                                 DISCHARGE SUMMARY   INITIAL ASSESSMENT AND DIAGNOSIS:  Abigail Mahoney was admitted to the hospital  because she was apparently paranoid.  She believed Mahoney mother had been  sleeping with Mahoney boyfriend.  She also believed that Mahoney boyfriend had been  sleeping with Abigail Mahoney and Mahoney neighbors as well.  At the time of  admission, she was denying any psychotic symptoms and did not want to be  here.  She had been at this hospital at least on one previous occasion and  was being followed up at the [facility].   MENTAL STATUS EXAMINATION:  Examination at the time of the initial  evaluation revealed an alert, oriented woman who was eager to talk.  She was  intrusive.  She made good eye contact.  She was very suspicious.  She had  flight of ideas.  She said she was happy.  She seemed euphoric with positive  paranoid ideation.  Thought processes were scattered.  She seemed  delusional.  She denied auditory hallucinations.  Memory was fair.  Judgment  was poor.  Insight was poor.   PHYSICAL EXAMINATION:  Essentially noncontributory.   ADMITTING DIAGNOSES:   AXIS I:  Bipolar disorder, not otherwise specified, with psychosis.   AXIS II:  Deferred.   AXIS III:  No diagnosis.   AXIS IV:  Moderate.   AXIS V:  25/60.   FINDINGS:  All indicated laboratory examinations were within normal limits  or noncontributory.   HOSPITAL COURSE:  While in the hospital, Abigail Mahoney continued to be  somewhat disordered as she had upon admission.  She remained suspicious and  somewhat paranoid, mainly  towards Mahoney ex-boyfriend.  Apparently he had been  with Mahoney for many years and had left for another woman which then  precipitated this psychotic episode.  She still believes things that were  not true such as Mahoney son was getting married by the time she was leaving the  hospital.  She also believed that Mahoney boyfriend had an affair with Mahoney  younger Mahoney.  Mahoney mother said these things were not true.  Mahoney mother was  very supportive of Mahoney and was willing to have Mahoney at home while she was  getting better.  She did not reach the point by the time of discharge where  she was no longer delusional, but the delusions were not so strong or  problematic that they were interfering with Mahoney daily life nor was  she  making any threats to hurt herself or anyone else.  Consequently she was  discharged home to Mahoney mother with the anticipation that the delusions would  gradually improve.  She was excited about discharge and wanting to be home  from the time she had come into the hospital.    FINAL DIAGNOSES:   AXIS I:  Bipolar disorder, not otherwise specified, with psychotic symptoms.   AXIS II:  Deferred.   AXIS III:  No diagnosis.   AXIS IV:  Moderate.   AXIS V:  45.   POST HOSPITAL CARE PLAN:  At the time of discharge, she was taking  alprazolam 0.5 mg three times a day as needed, and one mg at bedtime as  needed.  She took Depakote ER 500 mg two times a day and Risperdal 4 mg at  bedtime.  There were no restrictions placed on Mahoney activity or Mahoney diet.  She was to follow up with Abigail Mahoney with an appointment for the 8th of July  at the mental health center.                                               Carolanne Grumbling, M.D.    GT/MEDQ  D:  09/26/2002  T:  09/26/2002  Job:  [id number]

## 2010-07-26 NOTE — Procedures (Signed)
Abigail Mahoney, Abigail Mahoney              ACCOUNT NO.:  [id number]   MEDICAL RECORD NO.:  [id number]          PATIENT TYPE:  OUT   LOCATION:  SLEEP CENTER                 FACILITY:  [facility]   PHYSICIAN:  Clinton D. Maple Hudson, M.D. DATE OF BIRTH:  05/23/1955   DATE OF STUDY:                              NOCTURNAL POLYSOMNOGRAM   REFERRING PHYSICIAN:  Dr. Leilani Able.   DATE OF STUDY:  March 05, 2005.   INDICATION FOR STUDY:  Hypersomnia with sleep apnea.   EPWORTH SLEEPINESS SCORE:  3/24.   BMI:  30.5.   WEIGHT:  190 pounds.   HOME MEDICATIONS:  Depakote, Risperdal.   SLEEP ARCHITECTURE:  Total sleep time 362 minutes with sleep efficiency 94%.  Stage I 5%, stage II 72%, stages III and IV absent, REM 24% of total sleep  time. Sleep latency 12.5 minutes, REM latency 89 minutes, awake after sleep  onset 12 minutes, arousal index 29.8. No bedtime medication.   RESPIRATORY DATA:  NPSG protocol. Apnea/hypopnea index (AHI, RDI) 21.4  obstructive events per hour indicating moderate obstructive sleep  apnea/hypopnea syndrome. This included 39 obstructive apneas, 3 mixed apneas  and 87 hypopneas. Events were nonpositional, most common while supine and on  left side which were her dominant sleep positions. REM AHI 26.7 per hour.   OXYGEN DATA:  Moderate snoring with oxygen desaturation to a nadir of 88%.  Mean oxygen saturation through the study was 96% on room air.   CARDIAC DATA:  Sinus rhythm with occasional PACs.   MOVEMENT/PARASOMNIA:  Occasional leg jerks caused little sleep disturbance.  Bathroom x1.   IMPRESSION/RECOMMENDATIONS:  1.  Moderate obstructive sleep apnea/hypopnea syndrome, AHI 21.4 per hour      with moderate snoring and oxygen      desaturation to 88%. Nonpositional events.  2.  Consider return for C-PAP titration or evaluate for alternative      therapies as appropriate.      Clinton D. Maple Hudson, M.D.  Diplomate, Biomedical engineer of Sleep Medicine  Electronically  Signed     CDY/MEDQ  D:  03/09/2005 10:45:01  T:  03/09/2005 16:46:32  Job:  [id number]

## 2010-07-26 NOTE — H&P (Signed)
[name], Abigail Mahoney                        ACCOUNT NO.:  [id number]   MEDICAL RECORD NO.:  [id number]                   PATIENT TYPE:  IPS   LOCATION:  0404                                 [facility]ILITY[facility]  [facility]   PHYSICIAN:  Geoffery Lyons, M.D.                   DATE OF BIRTH:  05/17/1955   DATE OF ADMISSION:  09/05/2002  DATE OF DISCHARGE:                         PSYCHIATRIC ADMISSION ASSESSMENT   IDENTIFYING INFORMATION:  This is a 55 year old single African-American  female voluntarily admitted on September 05, 2002.   HISTORY OF PRESENT ILLNESS:  The patient presents on a voluntary admission  here because she is very upset with her mother.  She thinks that her mother  has been sleeping with her boyfriend.  She reports that her mother has  thrown her out of the house because she knows.  The patient reports that  her mother has control of her money and she knows that her boyfriend has  been sleeping with her sister and her neighbors as well.  The patient states  that she trusts her brother, who is a supportive family member.  She denies  any difficulty sleeping.  Her appetite has been satisfactory.  Denies any  psychotic symptoms and is anxious to go home.   PAST PSYCHIATRIC HISTORY:  Second hospitalization to [facility] per patient.  Admissions are not known at this time.  She reports  that she sees Harolyn Rutherford at [facility] as an  outpatient.   SOCIAL HISTORY:  This is a 55 year old single African-American female.  She  has a 31 year old son.  She lives with her boyfriend and she states she is  on disability.   FAMILY HISTORY:  Unknown.   ALCOHOL/DRUG HISTORY:  The patient smokes.  There is no apparent alcohol or  drug use.   PRIMARY CARE PHYSICIAN:  Unknown.   MEDICAL PROBLEMS:  None.   MEDICATIONS:  The patient states she has been on Zoloft (unknown dose) and  Xanax for the past two weeks.   ALLERGIES:  No known allergies.   REVIEW OF SYSTEMS:  No cardiac or pulmonary problems, endocrine, GI or GU.  The patient has some impaired vision, is far-sighted.  States she needs  glasses.  No musculoskeletal or skin problems.   PHYSICAL EXAMINATION:  VITAL SIGNS:  Temperature 97, heart rate 100,  respirations 20, blood pressure 127/95.  She is 215 pounds.  GENERAL:  The patient is somewhat anxious.  The patient has cream applied to  her eyebrows and toothpaste on her lips.  Disheveled and unkempt.  HEAD:  Normocephalic and atraumatic.  NECK:  Negative lymphadenopathy.  CHEST:  Clear to auscultation.  HEART:  Regular rate and rhythm.  ABDOMEN:  Soft, obese, nontender abdomen.  NEURO:  Muscle strength and tone is equal bilaterally.  Able to perform heel  to shin and normal alternating movements.  Negative CVA tenderness.  Nonfocal neuro findings.   LABORATORY DATA:  CBC with RDW elevated at 15.5.  CMET with glucose 116.  TSH 1.812.  Urine drug screen was positive for benzodiazepines.  Alcohol  level less than 5.   MENTAL STATUS EXAM:  She is alert and cooperative, very eager to talk.  She  is intrusive.  Good eye contact.  She is suspicious.  Speech is flight of  ideas, clear.  Mood:  The patient states she is happy.  Her affect is  euphoric with positive paranoid ideation.  Thought processes are scattered.  Positive delusions.  No auditory hallucinations.  Cognitive function intact.  Memory is fair.  Judgment is poor.  Insight is poor.   DIAGNOSES:   AXIS I:  Bipolar disorder with psychosis.   AXIS II:  Deferred.   AXIS III:  None.   AXIS IV:  Problems with primary support group, other psychosocial problems.   AXIS V:  Current 25; estimated this past year 60-65.   PLAN:  A voluntary admission for psychotic symptoms.  Contract for safety.  Check every 15 minutes.  The patient to be placed on the [address] for close  monitoring.  Will check labs.  Stabilize mood and thinking so patient can be  safe and  functional.  Initiate a mood stabilizer and antipsychotic to  control psychotic symptoms.  Will check levels for therapeutic range.  Family session with mother prior to discharge.  The patient to follow up  with mental health.  To be medication-compliant.   TENTATIVE LENGTH OF STAY:  Four to six days or more depending on patient's  response to medication.     Landry Corporal, N.P.                       Geoffery Lyons, M.D.    JO/MEDQ  D:  09/10/2002  T:  09/10/2002  Job:  [id number]

## 2010-07-26 NOTE — Procedures (Signed)
[name], Abigail Mahoney              ACCOUNT NO.:  [id number]   MEDICAL RECORD NO.:  [id number]          PATIENT TYPE:  OUT   LOCATION:  SLEEP CENTER                 [facility]ACILITY[facility]   PHYSICIAN:  Clinton D. Maple Hudson, M.D. DATE OF BIRTH:  October 23, 1955   DATE OF STUDY:  04/02/2005                              NOCTURNAL POLYSOMNOGRAM   REFERRING PHYSICIAN:  Dr. Leilani Able.   DATE OF STUDY:  April 02, 2005.   INDICATION FOR STUDY:  Hypersomnia with sleep apnea.   EPWORTH SLEEPINESS SCORE:  Zero per patient.   BMI:  30.5.   WEIGHT:  190 pounds.   A baseline diagnostic NPSG on March 05, 2005 reported an AHI of 21.4. C-  PAP titration is requested.   SLEEP ARCHITECTURE:  Total sleep time 375 minutes with sleep efficiency 73%.  Stage I was 12%, stage II 69%, stages III and IV absent, REM 19% of total  sleep time. Sleep latency 26 minutes, REM latency 186 minutes. Bedtime  medication was not reported. Home medications include Depakote and  Risperdal.   RESPIRATORY DATA:  C-PAP titration protocol: C-PAP was titrated to 18 CWP,  AHI 5.8 per hour. A large ResMed ultra mirage mask was used with heated  humidifier.   CARDIAC DATA:  Normal sinus rhythm with occasional PAC.   OXYGEN DATA:  Oxygen saturation with C-PAP control held 94-98% on room air  with snoring prevented.   MOVEMENT/PARASOMNIA:  Occasional leg jerk with little effect on sleep.   IMPRESSION/RECOMMENDATIONS:  1.  Successful C-PAP titration to 18 CWP, AHI 5.8 per hour. A large ResMed      ultra mirage full-face mask was      used with heated humidifier.  2.  Baseline diagnostic NPSG on March 05, 2005 reported an AHI 21.4 per      hour.      Clinton D. Maple Hudson, M.D.  Diplomate, Biomedical engineer of Sleep Medicine  Electronically Signed     CDY/MEDQ  D:  04/06/2005 12:46:46  T:  04/07/2005 02:37:57  Job:  [id number]

## 2010-08-01 ENCOUNTER — Encounter: Payer: Self-pay | Admitting: *Deleted

## 2010-08-07 ENCOUNTER — Encounter: Payer: Self-pay | Admitting: Cardiovascular Disease

## 2010-08-07 ENCOUNTER — Ambulatory Visit (INDEPENDENT_AMBULATORY_CARE_PROVIDER_SITE_OTHER): Payer: Medicaid Other | Admitting: Cardiovascular Disease

## 2010-08-07 ENCOUNTER — Ambulatory Visit (INDEPENDENT_AMBULATORY_CARE_PROVIDER_SITE_OTHER): Payer: Medicaid Other | Admitting: *Deleted

## 2010-08-07 DIAGNOSIS — I4891 Unspecified atrial fibrillation: Secondary | ICD-10-CM

## 2010-08-07 DIAGNOSIS — F17200 Nicotine dependence, unspecified, uncomplicated: Secondary | ICD-10-CM

## 2010-08-07 DIAGNOSIS — I509 Heart failure, unspecified: Secondary | ICD-10-CM

## 2010-08-07 DIAGNOSIS — F319 Bipolar disorder, unspecified: Secondary | ICD-10-CM

## 2010-08-07 DIAGNOSIS — I10 Essential (primary) hypertension: Secondary | ICD-10-CM

## 2010-08-07 LAB — POCT INR: INR: 2.1

## 2010-08-07 NOTE — Assessment & Plan Note (Signed)
F/U psych  More lablile today

## 2010-08-07 NOTE — Assessment & Plan Note (Signed)
Well controlled.  Continue current medications and low sodium Dash type diet.    

## 2010-08-07 NOTE — Assessment & Plan Note (Signed)
Euvolemic continue current meds   

## 2010-08-07 NOTE — Progress Notes (Signed)
Abigail Mahoney is seen today post hospital DC for afib, CHF.  She is doing well.  She was slow to ambulate and had a large echymosis on her right hip which has healed.  She is living on her own with nurnsing visits.  She continues to smoke and I counseled her on this.  She is not a candidate for Chantix given her other psychotropic drugs.  She has maintained NSR and would like home health to check her INR at assisted living which is fine.   She denies SSCP, palpitations, her breathing is much better and she is sleeping through the night.  She is having issues with her Risperdal and cogentin.  I think it is making her RUE tremor worse and she needs neurological F/U. This has been arranged with [facility] neuro on 11/8   We will try to expeditie this for her.  Echo in hospital showed EF 25-30% with moderate to severe MR.  She is not an ideal candidate for MV surgery.  She has no known CAD  Emotionally labile today.    ROS: Denies fever, malais, weight loss, blurry vision, decreased visual acuity, cough, sputum, SOB, hemoptysis, pleuritic pain, palpitaitons, heartburn, abdominal pain, melena, lower extremity edema, claudication, or rash.   General: Affect appropriate Obese with nicotine on breath HEENT: normal Neck supple with no adenopathy JVP normal no bruits no thyromegaly Lungs clear with no wheezing and good diaphragmatic motion Heart:  S1/S2 no murmur,rub, gallop or click PMI normal Abdomen: benighn, BS positve, no tenderness, no AAA no bruit.  No HSM or HJR Distal pulses intact with no bruits Trace bilateral LE  edema Neuro non-focal Skin warm and dry No muscular weakness   Current Outpatient Prescriptions  Medication Sig Dispense Refill  . benztropine (COGENTIN) 0.5 MG tablet Take 0.5 mg by mouth 2 (two) times daily.        . digoxin (LANOXIN) 0.125 MG tablet Take 125 mcg by mouth daily.        . divalproex (DEPAKOTE) 500 MG EC tablet Take 500 mg by mouth 2 (two) times daily.        .  furosemide (LASIX) 40 MG tablet Take 1 tablet (40 mg total) by mouth 2 (two) times daily.  60 tablet  6  . lisinopril (PRINIVIL,ZESTRIL) 5 MG tablet Take 5 mg by mouth daily.        . methimazole (TAPAZOLE) 10 MG tablet Take 10 mg by mouth 2 (two) times daily.        . metoprolol (TOPROL-XL) 100 MG 24 hr tablet TAKE 1 TABLET BY MOUTH TWICE A DAY  60 tablet  6  . potassium chloride SA (K-DUR,KLOR-CON) 20 MEQ tablet 1 tab in the morning, 1/2 tab at bedtime       . risperiDONE (RISPERDAL) 2 MG tablet Take 2 mg by mouth daily.        . sotalol (BETAPACE) 80 MG tablet Take 80 mg by mouth 2 (two) times daily.        Marland Kitchen warfarin (COUMADIN) 5 MG tablet Take 1 tablet (5 mg total) by mouth as directed.  30 tablet  3  . DISCONTD: benztropine (COGENTIN) 0.5 MG tablet Take 0.5 mg by mouth 2 (two) times daily.          Allergies  Review of patient's allergies indicates no known allergies.  Electrocardiogram:  NSR 82 Nonspecfic ST/T wave changes  Assessment and Plan

## 2010-08-07 NOTE — Patient Instructions (Signed)
Your physician recommends that you schedule a follow-up appointment in: 6 MONTHS WITH DR NISHAN  Your physician recommends that you continue on your current medications as directed. Please refer to the Current Medication list given to you today. 

## 2010-08-07 NOTE — Assessment & Plan Note (Signed)
Counseled for less than 10 minutes Not much insight into her disease processes

## 2010-08-07 NOTE — Assessment & Plan Note (Signed)
Maint NSR  [place] Score 3  Coumadin check today.  Continue coumadin  unless she has more bleeding complications

## 2010-08-28 ENCOUNTER — Ambulatory Visit (INDEPENDENT_AMBULATORY_CARE_PROVIDER_SITE_OTHER): Payer: Medicaid Other | Admitting: *Deleted

## 2010-08-28 DIAGNOSIS — I4891 Unspecified atrial fibrillation: Secondary | ICD-10-CM

## 2010-08-28 LAB — POCT INR: INR: 2.9

## 2010-09-25 ENCOUNTER — Ambulatory Visit (INDEPENDENT_AMBULATORY_CARE_PROVIDER_SITE_OTHER): Payer: Medicaid Other | Admitting: *Deleted

## 2010-09-25 DIAGNOSIS — I4891 Unspecified atrial fibrillation: Secondary | ICD-10-CM

## 2010-09-25 LAB — POCT INR: INR: 2.4

## 2010-10-14 ENCOUNTER — Other Ambulatory Visit: Payer: Self-pay | Admitting: *Deleted

## 2010-10-14 MED ORDER — POTASSIUM CHLORIDE CRYS ER 20 MEQ PO TBCR: 20.0000 meq | EXTENDED_RELEASE_TABLET | Freq: Every day | ORAL | Status: DC

## 2010-10-23 ENCOUNTER — Ambulatory Visit (INDEPENDENT_AMBULATORY_CARE_PROVIDER_SITE_OTHER): Payer: Medicaid Other | Admitting: *Deleted

## 2010-10-23 DIAGNOSIS — I4891 Unspecified atrial fibrillation: Secondary | ICD-10-CM

## 2010-10-23 LAB — POCT INR: INR: 2.2

## 2010-11-15 ENCOUNTER — Other Ambulatory Visit: Payer: Self-pay | Admitting: Cardiovascular Disease

## 2010-11-19 ENCOUNTER — Other Ambulatory Visit: Payer: Self-pay | Admitting: Cardiology

## 2010-11-19 MED ORDER — WARFARIN SODIUM 5 MG PO TABS: 5.0000 mg | ORAL_TABLET | ORAL | Status: DC

## 2010-11-19 NOTE — Telephone Encounter (Signed)
Med refill of coumadin

## 2010-11-20 ENCOUNTER — Ambulatory Visit (INDEPENDENT_AMBULATORY_CARE_PROVIDER_SITE_OTHER): Payer: Medicaid Other | Admitting: *Deleted

## 2010-11-20 DIAGNOSIS — I4891 Unspecified atrial fibrillation: Secondary | ICD-10-CM

## 2010-12-05 LAB — BASIC METABOLIC PANEL
CO2: 24
CO2: 28
CO2: 23
Calcium: 8.5
Calcium: 9.4
Calcium: 8.8
Chloride: 106
Chloride: 106
Chloride: 101
Creatinine, Ser: 0.83
GFR calc Af Amer: >60
GFR calc Af Amer: >60
GFR calc Af Amer: >60
GFR calc Af Amer: >60
GFR calc non Af Amer: >60
GFR calc non Af Amer: >60
Glucose, Bld: 79
Glucose, Bld: 87
Potassium: 4.2
Potassium: 4.5
Potassium: 5.1
Sodium: 137
Sodium: 139
Sodium: 139
Sodium: 142
Sodium: 134 — ABNORMAL LOW

## 2010-12-05 LAB — CBC
HCT: 32.4 — ABNORMAL LOW
HCT: 33.6 — ABNORMAL LOW
HCT: 33.9 — ABNORMAL LOW
HCT: 34.3 — ABNORMAL LOW
HCT: 38.4
Hemoglobin: 10.9 — ABNORMAL LOW
Hemoglobin: 11.3 — ABNORMAL LOW
Hemoglobin: 11.8 — ABNORMAL LOW
Hemoglobin: 13.2
Hemoglobin: 12
MCHC: 34.9
MCHC: 32.9
MCV: 88.2
MCV: 89.2
Platelets: 145 — ABNORMAL LOW
Platelets: 160
RBC: 3.75 — ABNORMAL LOW
RBC: 3.84 — ABNORMAL LOW
RBC: 4.31
RBC: 4.27
RDW: 17.3 — ABNORMAL HIGH
WBC: 7.2
WBC: 8.5
WBC: 8.6
WBC: 8.8
WBC: 8.7

## 2010-12-05 LAB — COMPREHENSIVE METABOLIC PANEL
ALT: 45 — ABNORMAL HIGH
AST: 29
Albumin: 2.8 — ABNORMAL LOW
CO2: 23
Calcium: 8.7
Creatinine, Ser: 0.82
GFR calc Af Amer: >60
GFR calc non Af Amer: >60
Sodium: 138
Total Protein: 5.9 — ABNORMAL LOW

## 2010-12-05 LAB — URINALYSIS, ROUTINE W REFLEX MICROSCOPIC
Ketones, ur: NEGATIVE
Nitrite: NEGATIVE
Specific Gravity, Urine: 1.005
Urobilinogen, UA: 0.2
pH: 5.5

## 2010-12-05 LAB — CARDIAC PANEL(CRET KIN+CKTOT+MB+TROPI)
CK, MB: 3.6
Relative Index: 1.5
Total CK: 248 — ABNORMAL HIGH
Troponin I: <0.01

## 2010-12-05 LAB — DIFFERENTIAL
Lymphs Abs: 3
Monocytes Absolute: 0.8
Monocytes Relative: 9
Neutro Abs: 4.9
Neutrophils Relative %: 56

## 2010-12-05 LAB — APTT
aPTT: 29
aPTT: 28

## 2010-12-05 LAB — URINE CULTURE: Colony Count: NO GROWTH

## 2010-12-05 LAB — CK TOTAL AND CKMB (NOT AT ARMC)
CK, MB: 3.6
Relative Index: 1.3
Total CK: 267 — ABNORMAL HIGH

## 2010-12-05 LAB — PROTIME-INR
INR: 1.2
INR: 1.5
INR: 2.1 — ABNORMAL HIGH
INR: 4 — ABNORMAL HIGH
INR: 1.1
Prothrombin Time: 18.9 — ABNORMAL HIGH
Prothrombin Time: 41.5 — ABNORMAL HIGH
Prothrombin Time: 42.7 — ABNORMAL HIGH

## 2010-12-05 LAB — B-NATRIURETIC PEPTIDE (CONVERTED LAB): Pro B Natriuretic peptide (BNP): 691 — ABNORMAL HIGH

## 2010-12-05 LAB — TROPONIN I: Troponin I: 0.01

## 2010-12-05 LAB — T3 UPTAKE: T3 Uptake Ratio: 41.3 — ABNORMAL HIGH

## 2010-12-05 LAB — HEPARIN LEVEL (UNFRACTIONATED): Heparin Unfractionated: 0.51

## 2010-12-17 ENCOUNTER — Telehealth: Payer: Self-pay | Admitting: Cardiovascular Disease

## 2010-12-17 NOTE — Telephone Encounter (Signed)
Eber Jones a nurse from [facility] department called, concerned about patient. About 1:30 to 2:30 PM patient's B/P 86/62 on right arm and 78/60 in left arm patient denies any symptoms. Eber Jones said patient take her PM medications about the time she went to visit her today.at 2:30 PM. When I called patient states has not taken her pm medication, she takes her pm medication about 7:00 PM. Patient denies any symptoms.

## 2010-12-17 NOTE — Telephone Encounter (Signed)
Pt having b/p issues

## 2010-12-18 ENCOUNTER — Ambulatory Visit (INDEPENDENT_AMBULATORY_CARE_PROVIDER_SITE_OTHER): Payer: Medicaid Other | Admitting: *Deleted

## 2010-12-18 DIAGNOSIS — I4891 Unspecified atrial fibrillation: Secondary | ICD-10-CM

## 2011-01-15 ENCOUNTER — Ambulatory Visit (INDEPENDENT_AMBULATORY_CARE_PROVIDER_SITE_OTHER): Payer: Medicaid Other | Admitting: *Deleted

## 2011-01-15 DIAGNOSIS — I4891 Unspecified atrial fibrillation: Secondary | ICD-10-CM

## 2011-01-15 DIAGNOSIS — Z7901 Long term (current) use of anticoagulants: Secondary | ICD-10-CM

## 2011-01-20 ENCOUNTER — Other Ambulatory Visit: Payer: Self-pay

## 2011-01-20 MED ORDER — FUROSEMIDE 40 MG PO TABS: 40.0000 mg | ORAL_TABLET | Freq: Two times a day (BID) | ORAL | Status: DC

## 2011-01-20 NOTE — Telephone Encounter (Signed)
.   Requested Prescriptions   Signed Prescriptions Disp Refills  . furosemide (LASIX) 40 MG tablet 60 tablet 6    Sig: Take 1 tablet (40 mg total) by mouth 2 (two) times daily.    Authorizing Provider: Wendall Stade    Ordering User: Lacie Scotts

## 2011-02-11 ENCOUNTER — Ambulatory Visit (INDEPENDENT_AMBULATORY_CARE_PROVIDER_SITE_OTHER): Payer: Medicaid Other | Admitting: Physician Assistant

## 2011-02-11 ENCOUNTER — Ambulatory Visit (INDEPENDENT_AMBULATORY_CARE_PROVIDER_SITE_OTHER): Payer: Medicaid Other | Admitting: Cardiovascular Disease

## 2011-02-11 ENCOUNTER — Encounter: Payer: Self-pay | Admitting: Cardiovascular Disease

## 2011-02-11 ENCOUNTER — Ambulatory Visit (INDEPENDENT_AMBULATORY_CARE_PROVIDER_SITE_OTHER): Payer: Medicaid Other | Admitting: *Deleted

## 2011-02-11 DIAGNOSIS — R0602 Shortness of breath: Secondary | ICD-10-CM

## 2011-02-11 DIAGNOSIS — I340 Nonrheumatic mitral (valve) insufficiency: Secondary | ICD-10-CM | POA: Insufficient documentation

## 2011-02-11 DIAGNOSIS — I059 Rheumatic mitral valve disease, unspecified: Secondary | ICD-10-CM

## 2011-02-11 DIAGNOSIS — I4891 Unspecified atrial fibrillation: Secondary | ICD-10-CM

## 2011-02-11 DIAGNOSIS — F17200 Nicotine dependence, unspecified, uncomplicated: Secondary | ICD-10-CM

## 2011-02-11 DIAGNOSIS — F319 Bipolar disorder, unspecified: Secondary | ICD-10-CM

## 2011-02-11 DIAGNOSIS — I10 Essential (primary) hypertension: Secondary | ICD-10-CM

## 2011-02-11 DIAGNOSIS — Z7901 Long term (current) use of anticoagulants: Secondary | ICD-10-CM

## 2011-02-11 NOTE — Assessment & Plan Note (Signed)
Well controlled.  Continue current medications and low sodium Dash type diet.    

## 2011-02-11 NOTE — Assessment & Plan Note (Signed)
COPD clincially.  Not a candidate for any open cardiac surgery.  MR not heard on exam

## 2011-02-11 NOTE — Assessment & Plan Note (Signed)
Likely related to persistant afib.  Euvolemic and functional limitations from bipolar disease and orthopedic issues not heart.  Consder echo F/U if dyspnea worsens.  Not a candidate for surgery

## 2011-02-11 NOTE — Assessment & Plan Note (Signed)
Asymptomatic.  Continue coumadin  No bleeding problems  Failed DCC in past

## 2011-02-11 NOTE — Assessment & Plan Note (Signed)
Stable living independantly and has good family support

## 2011-02-11 NOTE — Patient Instructions (Addendum)
Your physician wants you to follow-up in:  6 months. You will receive a reminder letter in the mail two months in advance. If you don't receive a letter, please call our office to schedule the follow-up appointment.   

## 2011-02-11 NOTE — Assessment & Plan Note (Signed)
Counseled for less than 10 minutes.  No insight into quitting due to bipolar disease.

## 2011-02-11 NOTE — Progress Notes (Signed)
   [facility] Follow-up Outpatient Visit  Abigail Mahoney 09/02/1955  Date: 02/11/11   Subjective: Abigail Mahoney's mother accompanied her to her appointment today. [name] his mother learned, that she'll had been canceling her appointments. There is some concern, about she'll his ability to continue to care for herself, and arrangements are being made for her to move to an assisted living facility. [name] reports that her mood has been stable, that she continues to take her medication as prescribed. She states that her sleep is good, and she is eating well. She denies any suicidal or homicidal ideation. She denies any auditory or visual hallucinations.  There were no vitals filed for this visit.  Mental Status Examination  Appearance: Casual Alert: Yes Attention: good  Cooperative: Yes Eye Contact: Good Speech: Clear and even Psychomotor Activity: Increased Memory/Concentration: Impaired Oriented: person, place and situation Mood: Anxious and Depressed Affect: Congruent Thought Processes and Associations: Logical Fund of Knowledge: Poor Thought Content:  Insight: Poor Judgement: Poor  Diagnosis: Bipolar disorder, unspecified type  Treatment Plan: We will continue her medications as prescribed. I would like to, have she'll return in one month for followup.  Lafonda Patron, PA

## 2011-02-11 NOTE — Progress Notes (Signed)
Abigail Mahoney is seen today post hospital DC for afib, CHF. She is doing well. She was slow to ambulate and had a large echymosis on her right hip which has healed. She is living on her own with nurnsing visits. She continues to smoke and I counseled her on this. She is not a candidate for Chantix given her other psychotropic drugs. She has maintained NSR and would like home health to check her INR at assisted living which is fine. She denies SSCP, palpitations, her breathing is much better and she is sleeping through the night. She is having issues with her Risperdal and cogentin. I think it is making her RUE tremor worse and she needs neurological F/U. This has been arranged with [facility] neuro on 11/8 We will try to expeditie this for her. Echo in hospital showed EF 25-30% with moderate to severe MR. She is not an ideal candidate for MV surgery. She has no known CAD Emotionally labile today.    ROS: Denies fever, malais, weight loss, blurry vision, decreased visual acuity, cough, sputum, SOB, hemoptysis, pleuritic pain, palpitaitons, heartburn, abdominal pain, melena, lower extremity edema, claudication, or rash.  All other systems reviewed and negative  General: Affect appropriate Healthy:  appears stated age HEENT: normal Neck supple with no adenopathy JVP normal no bruits no thyromegaly Lungs clear with no wheezing and good diaphragmatic motion Heart:  S1/S2 no murmur,rub, gallop or click PMI normal Abdomen: benighn, BS positve, no tenderness, no AAA no bruit.  No HSM or HJR Distal pulses intact with no bruits No edema Neuro non-focal Skin warm and dry No muscular weakness   Current Outpatient Prescriptions  Medication Sig Dispense Refill  . benztropine (COGENTIN) 0.5 MG tablet Take 0.5 mg by mouth 2 (two) times daily.        . digoxin (LANOXIN) 0.125 MG tablet TAKE 1 TABLET BY MOUTH EVERY DAY  30 tablet  6  . divalproex (DEPAKOTE) 500 MG EC tablet Take 500 mg by mouth 2 (two) times daily.         . furosemide (LASIX) 40 MG tablet Take 1 tablet (40 mg total) by mouth 2 (two) times daily.  60 tablet  6  . lisinopril (PRINIVIL,ZESTRIL) 5 MG tablet TAKE 1 TABLET BY MOUTH EVERY DAY  30 tablet  6  . methimazole (TAPAZOLE) 10 MG tablet Take 10 mg by mouth 2 (two) times daily.        . metoprolol (TOPROL-XL) 100 MG 24 hr tablet TAKE 1 TABLET BY MOUTH TWICE A DAY  60 tablet  6  . potassium chloride SA (K-DUR,KLOR-CON) 20 MEQ tablet Take 1 tablet (20 mEq total) by mouth daily. 1 tab in the morning, 1/2 tab at bedtime  45 tablet  6  . risperiDONE (RISPERDAL) 2 MG tablet Take 2 mg by mouth daily.        . sotalol (BETAPACE) 80 MG tablet TAKE 1 TABLET BY MOUTH TWICE A DAY  60 tablet  6  . warfarin (COUMADIN) 5 MG tablet Take 1 tablet (5 mg total) by mouth as directed.  30 tablet  3    Allergies  Review of patient's allergies indicates no known allergies.  Electrocardiogram:  Assessment and Plan

## 2011-02-12 ENCOUNTER — Telehealth: Payer: Self-pay | Admitting: Cardiovascular Disease

## 2011-02-12 NOTE — Telephone Encounter (Signed)
New message:  Phar needs new pres for her Metoprolol called into them.

## 2011-02-13 MED ORDER — METOPROLOL SUCCINATE ER 100 MG PO TB24: 100.0000 mg | ORAL_TABLET | Freq: Two times a day (BID) | ORAL | Status: DC

## 2011-02-26 ENCOUNTER — Telehealth: Payer: Self-pay | Admitting: Cardiovascular Disease

## 2011-02-26 NOTE — Telephone Encounter (Signed)
New message:  Please call pharmacy, they have a question about two of her medications.

## 2011-02-26 NOTE — Telephone Encounter (Signed)
I spoke with the pharmacist. They were calling to verify if the patient is supposed to be on metoprolol and sotalol. In reviewing the patient's chart, her last visit with Dr. Eden Emms on 02/11/11 does note that she is on both sotalol 80mg  bid and metoprolol 100mg  bid. The metoprolol listed in her chart is for extended release, so I wonder if that is an error in itself and should be for metoprolol tartrate since it is twice daily. I informed the pharmacist that Dr. Eden Emms is in the office tomorrow and we will review this with him to make sure he wants her on metoprolol and sotalol both. She is also on digoxin. If she is to be continued on metoprolol, she will need refills sent to the pharmacy. I will forward to Scherrie Bateman, LPN and Dr. Eden Emms for review.

## 2011-02-28 ENCOUNTER — Other Ambulatory Visit: Payer: Self-pay | Admitting: *Deleted

## 2011-02-28 MED ORDER — METOPROLOL TARTRATE 100 MG PO TABS: 100.0000 mg | ORAL_TABLET | Freq: Two times a day (BID) | ORAL | Status: DC

## 2011-02-28 MED ORDER — WARFARIN SODIUM 5 MG PO TABS: 5.0000 mg | ORAL_TABLET | ORAL | Status: DC

## 2011-02-28 NOTE — Telephone Encounter (Signed)
PER DR NISHAN PT TO REMAIN ON SOTALOL AND LOPRESSOR  FOR VERY ELEVATED HR  NEEDS TO F/U WITH  THYROID ISSUES AND MAY MAKE CHANGES  ACCORDINGLY SEE DR NISHAN'S RESPONSE UNDER REVIEW REPORTS .Abigail Mahoney

## 2011-03-08 ENCOUNTER — Other Ambulatory Visit: Payer: Self-pay | Admitting: Cardiovascular Disease

## 2011-03-12 ENCOUNTER — Other Ambulatory Visit (HOSPITAL_COMMUNITY): Payer: Self-pay | Admitting: Physician Assistant

## 2011-03-12 ENCOUNTER — Ambulatory Visit (INDEPENDENT_AMBULATORY_CARE_PROVIDER_SITE_OTHER): Payer: Medicaid Other | Admitting: *Deleted

## 2011-03-12 DIAGNOSIS — F319 Bipolar disorder, unspecified: Secondary | ICD-10-CM

## 2011-03-12 DIAGNOSIS — I4891 Unspecified atrial fibrillation: Secondary | ICD-10-CM

## 2011-03-12 DIAGNOSIS — Z7901 Long term (current) use of anticoagulants: Secondary | ICD-10-CM

## 2011-03-12 MED ORDER — DIVALPROEX SODIUM 500 MG PO DR TAB: 500.0000 mg | DELAYED_RELEASE_TABLET | Freq: Two times a day (BID) | ORAL | Status: DC

## 2011-03-12 MED ORDER — BENZTROPINE MESYLATE 0.5 MG PO TABS: 0.5000 mg | ORAL_TABLET | Freq: Two times a day (BID) | ORAL | Status: DC

## 2011-03-12 MED ORDER — RISPERIDONE 2 MG PO TABS: 2.0000 mg | ORAL_TABLET | Freq: Every day | ORAL | Status: DC

## 2011-03-13 NOTE — Telephone Encounter (Signed)
SPOKE WITH PT RE THE NEED TO FOLLOW UP  WITH DR Lucianne Muss  FOR THYROID  IF UNABLE TO  GET APPT WITHIN  THE NEXT WEEK OR SO NEEDS TO  HAVE TSH AND FREE T4  CHECKED HERE  TO MAKE DECISION RE  CONT  TAKING BOTH TOPROL AND  SOTALOL PER DR NISHAN./CY

## 2011-03-17 ENCOUNTER — Other Ambulatory Visit (HOSPITAL_COMMUNITY): Payer: Self-pay | Admitting: Physician Assistant

## 2011-03-17 DIAGNOSIS — F319 Bipolar disorder, unspecified: Secondary | ICD-10-CM

## 2011-03-17 MED ORDER — RISPERIDONE 2 MG PO TABS: 2.0000 mg | ORAL_TABLET | Freq: Every day | ORAL | Status: DC

## 2011-03-17 MED ORDER — BENZTROPINE MESYLATE 0.5 MG PO TABS: 0.5000 mg | ORAL_TABLET | Freq: Two times a day (BID) | ORAL | Status: DC

## 2011-03-17 MED ORDER — DIVALPROEX SODIUM 500 MG PO DR TAB: 500.0000 mg | DELAYED_RELEASE_TABLET | Freq: Two times a day (BID) | ORAL | Status: DC

## 2011-03-18 ENCOUNTER — Ambulatory Visit (INDEPENDENT_AMBULATORY_CARE_PROVIDER_SITE_OTHER): Payer: Medicaid Other | Admitting: Physician Assistant

## 2011-03-18 DIAGNOSIS — F319 Bipolar disorder, unspecified: Secondary | ICD-10-CM

## 2011-03-18 NOTE — Progress Notes (Signed)
   [facility] Behavioral Health Follow-up Outpatient Visit  Abigail Mahoney 06-23-1955  Date: 03/18/11   Subjective: Abigail Mahoney was seen again today with her mother. [name] reports that she has been calling her mother some 30 times daily and wants to know how to stop. Her mother reports, that she'll is not only calling her but her sister, and others as well. Plans are progressing to find [facility] a nursing facility to move to. [name] reports that she is looking forward to this move because she will have others to talk to more frequently. [name] it denies any suicidal or homicidal ideation. She denies any auditory or visual hallucinations. She reports that her sleep and appetite are both good. [facility] at [facility] [place] 2 days a week.  There were no vitals filed for this visit.  Mental Status Examination  Appearance: Casual Alert: Yes Attention: good  Cooperative: Yes Eye Contact: Good Speech: Clear and loud Psychomotor Activity: Normal Memory/Concentration: Fair Oriented: person, place, time/date and situation Mood: Anxious and Depressed Affect: Full Range Thought Processes and Associations: Logical Fund of Knowledge: Fair Thought Content:  Insight: Poor Judgement: Fair  Diagnosis: Bipolar disorder, not otherwise specified  Treatment Plan: Continue medications as currently prescribed. Continue with the search to find appropriate placement in the meantime, it has been recommended, that she'll increase her involvement at the [facility]. We will see her back in one month and if improvement has not been made, we will consider increasing her Risperdal.   Abigail Berninger, PA

## 2011-04-09 ENCOUNTER — Ambulatory Visit (INDEPENDENT_AMBULATORY_CARE_PROVIDER_SITE_OTHER): Payer: Medicaid Other | Admitting: *Deleted

## 2011-04-09 DIAGNOSIS — I4891 Unspecified atrial fibrillation: Secondary | ICD-10-CM

## 2011-04-09 DIAGNOSIS — Z7901 Long term (current) use of anticoagulants: Secondary | ICD-10-CM

## 2011-04-29 ENCOUNTER — Ambulatory Visit (HOSPITAL_COMMUNITY): Payer: Medicaid Other | Admitting: Physician Assistant

## 2011-05-07 ENCOUNTER — Ambulatory Visit (INDEPENDENT_AMBULATORY_CARE_PROVIDER_SITE_OTHER): Payer: Medicaid Other | Admitting: Pharmacist

## 2011-05-07 DIAGNOSIS — Z7901 Long term (current) use of anticoagulants: Secondary | ICD-10-CM

## 2011-05-07 DIAGNOSIS — I4891 Unspecified atrial fibrillation: Secondary | ICD-10-CM

## 2011-05-07 LAB — POCT INR: INR: 2.8

## 2011-05-13 ENCOUNTER — Other Ambulatory Visit: Payer: Self-pay

## 2011-05-13 ENCOUNTER — Other Ambulatory Visit: Payer: Self-pay | Admitting: Cardiovascular Disease

## 2011-05-13 MED ORDER — POTASSIUM CHLORIDE CRYS ER 20 MEQ PO TBCR: 20.0000 meq | EXTENDED_RELEASE_TABLET | Freq: Every day | ORAL | Status: DC

## 2011-05-19 ENCOUNTER — Ambulatory Visit (HOSPITAL_COMMUNITY): Payer: Medicaid Other | Admitting: Physician Assistant

## 2011-06-04 ENCOUNTER — Ambulatory Visit (INDEPENDENT_AMBULATORY_CARE_PROVIDER_SITE_OTHER): Payer: Medicaid Other | Admitting: *Deleted

## 2011-06-04 DIAGNOSIS — Z7901 Long term (current) use of anticoagulants: Secondary | ICD-10-CM

## 2011-06-04 DIAGNOSIS — I4891 Unspecified atrial fibrillation: Secondary | ICD-10-CM

## 2011-06-04 LAB — PROTIME-INR
INR: 8 ratio (ref 0.8–1.0)
Prothrombin Time: 89.9 s (ref 10.2–12.4)

## 2011-06-04 LAB — POCT INR: INR: 6.9

## 2011-06-10 ENCOUNTER — Other Ambulatory Visit (HOSPITAL_COMMUNITY): Payer: Self-pay | Admitting: Physician Assistant

## 2011-06-10 ENCOUNTER — Other Ambulatory Visit: Payer: Self-pay | Admitting: Cardiovascular Disease

## 2011-06-11 ENCOUNTER — Ambulatory Visit (INDEPENDENT_AMBULATORY_CARE_PROVIDER_SITE_OTHER): Payer: Medicaid Other | Admitting: *Deleted

## 2011-06-11 DIAGNOSIS — Z7901 Long term (current) use of anticoagulants: Secondary | ICD-10-CM

## 2011-06-11 DIAGNOSIS — I4891 Unspecified atrial fibrillation: Secondary | ICD-10-CM

## 2011-06-11 LAB — POCT INR: INR: 1.4

## 2011-06-18 ENCOUNTER — Ambulatory Visit (INDEPENDENT_AMBULATORY_CARE_PROVIDER_SITE_OTHER): Payer: Medicaid Other | Admitting: *Deleted

## 2011-06-18 DIAGNOSIS — I4891 Unspecified atrial fibrillation: Secondary | ICD-10-CM

## 2011-06-18 DIAGNOSIS — Z7901 Long term (current) use of anticoagulants: Secondary | ICD-10-CM

## 2011-06-18 LAB — POCT INR: INR: 2.3

## 2011-07-02 ENCOUNTER — Telehealth: Payer: Self-pay | Admitting: *Deleted

## 2011-07-02 ENCOUNTER — Ambulatory Visit (INDEPENDENT_AMBULATORY_CARE_PROVIDER_SITE_OTHER): Payer: Medicaid Other | Admitting: *Deleted

## 2011-07-02 DIAGNOSIS — Z7901 Long term (current) use of anticoagulants: Secondary | ICD-10-CM

## 2011-07-02 DIAGNOSIS — I4891 Unspecified atrial fibrillation: Secondary | ICD-10-CM

## 2011-07-02 NOTE — Telephone Encounter (Signed)
Gave information regarding coumadin dosing instructions to Abigail Mahoney, she states she will get in contact with mother and instruct.

## 2011-07-10 ENCOUNTER — Other Ambulatory Visit: Payer: Self-pay | Admitting: Cardiovascular Disease

## 2011-07-14 ENCOUNTER — Telehealth: Payer: Self-pay | Admitting: Cardiovascular Disease

## 2011-07-14 NOTE — Telephone Encounter (Signed)
Pt has had some changes in her b/p meds and now pt's b/p is 80/60 and sister is very upset and wants to know if pt can be seen today and what can be done about b/p meds because it has been some changes and sister is not sure who changed it

## 2011-07-14 NOTE — Telephone Encounter (Signed)
SPOKE WITH  PT'S SISTER PT  HAS JUST RECENTLY SEEN  PMD AT [facility] AND THOUGHT  B/P MED WAS DECREASED , NURSE CHECKED  B/P AND HR  TODAY  NOTE VALUES  IN MESSAGE .  INFORMED  SISTER THAT PMD  CAN ADJUST  B/P MEDS  AND IF PT  COULD  CHECK B/P  ON DAILY  BASIS  NOT SURE  WILL NEED TO CHANGE MED AGAIN BASED ON  1 VALUE ,PER SISTER  PT IS BIPOLAR AND THAT WOULD BE DIFFICULT TO DO   PT HAS NO COMPLAINTS PER SISTER INSTRUCTED TO CALL  PMD  WITH READING  AND PT  HAS F/U NEXT MONTH WITH DR Eden Emms

## 2011-07-14 NOTE — Telephone Encounter (Signed)
Fu call Pt's sister calling back again about metoprolol med being changed

## 2011-07-14 NOTE — Telephone Encounter (Signed)
I talked with pt and she states she is feeling fine now and when BP was checked this morning. Pt  without complaints.  Per Eber Jones pt's metoprolol ER 100mg  was decreased from bid to qd by her PCP within the last few months due to low BP-she was not sure exactly when that was done. She is not due to check pt's BP again for 2 weeks but can check it sooner if she has an order. I will forward to Dr Eden Emms for review.

## 2011-07-14 NOTE — Telephone Encounter (Signed)
Spoke with pt. Pt states when nurse, Eber Jones, came to fill her medication box today her BP was 80/60 and her heart rate was 68.

## 2011-07-14 NOTE — Telephone Encounter (Signed)
Meds reviewed with Eber Jones.

## 2011-07-15 ENCOUNTER — Telehealth: Payer: Self-pay | Admitting: Cardiology

## 2011-07-15 ENCOUNTER — Ambulatory Visit (INDEPENDENT_AMBULATORY_CARE_PROVIDER_SITE_OTHER): Payer: Medicaid Other

## 2011-07-15 VITALS — BP 70/56 | HR 80 | Wt 187.4 lb

## 2011-07-15 DIAGNOSIS — I10 Essential (primary) hypertension: Secondary | ICD-10-CM

## 2011-07-15 NOTE — Telephone Encounter (Signed)
Returned a call to [name]. She wanted to leave the phone number to the nurse who takes care of her medications. Her name is Abigail Mahoney and her phone number is [phone]  Berton Mount PA-C 07/15/2011 6:16 PM

## 2011-07-15 NOTE — Progress Notes (Signed)
Patient walked in office stated she needed B/P checked.Stated her nurse Eber Jones at [facility] checked her B/P today and said it was low.Patient was tearful and crying worried because her B/P low.Patient states Eber Jones prepares her medication box and she does not know what she takes.Spoke to Norma Fredrickson NP she advised to decrease metoprolol to 50 mg twice daily.Appointment scheduled with Lawson Fiscal 07/22/11.

## 2011-07-22 ENCOUNTER — Ambulatory Visit (INDEPENDENT_AMBULATORY_CARE_PROVIDER_SITE_OTHER): Payer: Medicaid Other | Admitting: Nurse Practitioner

## 2011-07-22 ENCOUNTER — Encounter: Payer: Self-pay | Admitting: Nurse Practitioner

## 2011-07-22 VITALS — BP 90/60 | HR 64 | Ht 64.0 in | Wt 189.0 lb

## 2011-07-22 DIAGNOSIS — I5020 Unspecified systolic (congestive) heart failure: Secondary | ICD-10-CM

## 2011-07-22 DIAGNOSIS — I428 Other cardiomyopathies: Secondary | ICD-10-CM

## 2011-07-22 DIAGNOSIS — I340 Nonrheumatic mitral (valve) insufficiency: Secondary | ICD-10-CM

## 2011-07-22 DIAGNOSIS — I059 Rheumatic mitral valve disease, unspecified: Secondary | ICD-10-CM

## 2011-07-22 DIAGNOSIS — I4891 Unspecified atrial fibrillation: Secondary | ICD-10-CM

## 2011-07-22 LAB — BASIC METABOLIC PANEL
BUN: 23 mg/dL (ref 6–23)
CO2: 27 mEq/L (ref 19–32)
Calcium: 8.9 mg/dL (ref 8.4–10.5)
Chloride: 102 mEq/L (ref 96–112)
Creatinine, Ser: 1.7 mg/dL — ABNORMAL HIGH (ref 0.4–1.2)
GFR: 39.91 mL/min — ABNORMAL LOW (ref >60.00)
Glucose, Bld: 87 mg/dL (ref 70–99)
Potassium: 4.7 mEq/L (ref 3.5–5.1)
Sodium: 137 mEq/L (ref 135–145)

## 2011-07-22 NOTE — Assessment & Plan Note (Signed)
Patient has known nonischemic CM. EF is 25 to 30% per echo in 2011. She is currently asymptomatic. Initial blood pressure today was low, but no different from her last visit here in December. After [name] left, I was able to speak with the nurse who fills her pill box. She has informed me the the Toprol was cut back about a week ago to 50mg . She agrees that Darletta is asymptomatic. I have explained that with her heart situation, this blood pressure is acceptable as long as she is not symptomatic. For now, I have not changed any of her medicines. We will recheck her next week. Patient is agreeable to this plan and will call if any problems develop in the interim.

## 2011-07-22 NOTE — Progress Notes (Signed)
Abigail Mahoney Date of Birth: 01-Sep-1955 Medical Record #[id number]  History of Present Illness: Abigail Mahoney is seen today for a work in visit. She is seen for Dr. Eden Emms. She is a 56 year old black female with bipolar disorder and emotional lability. She has a nonischemic CM with last echo showing EF 25 to 30%. She is on beta blocker, ACE, digoxin and Lasix. She has a nurse Marylou Mccoy [phone]) who fills her pill box. She has atrial fib and has been on chronic coumadin. She has failed with cardioversion in the past.   [name] is seen here today for low blood pressure. She is here alone. She has no symptoms whatsoever. She is crying profusely about her blood pressure being low. She is adamant that she is not dizzy, lightheaded, fatigued. Etc. No chest pain. Not short of breath. Has no swelling. She does continue to smoke.   Current Outpatient Prescriptions on File Prior to Visit  Medication Sig Dispense Refill  . benztropine (COGENTIN) 0.5 MG tablet TAKE 1 TABLET (0.5 MG TOTAL) BY MOUTH 2 (TWO) TIMES DAILY.  60 tablet  2  . celecoxib (CELEBREX) 200 MG capsule Take 1 capsule (200 mg total) by mouth daily.      Marland Kitchen desonide (DESOWEN) 0.05 % cream Apply to face prn  30 g  0  . digoxin (LANOXIN) 0.125 MG tablet TAKE ONE TABLET BY MOUTH DAILY  30 tablet  0  . divalproex (DEPAKOTE) 500 MG DR tablet TAKE 1 TABLET (500 MG TOTAL) BY MOUTH 2 (TWO) TIMES DAILY.  60 tablet  2  . furosemide (LASIX) 40 MG tablet Take 1 tablet (40 mg total) by mouth 2 (two) times daily.  60 tablet  6  . ibuprofen (ADVIL) 200 MG tablet Take 1 tablet (200 mg total) by mouth daily.      Marland Kitchen lisinopril (PRINIVIL,ZESTRIL) 5 MG tablet TAKE ONE TABLET BY MOUTH DAILY  30 tablet  0  . metoprolol succinate (TOPROL-XL) 100 MG 24 hr tablet 100 mg daily. Take 50mg  with or immediately following a meal.      . potassium chloride SA (K-DUR,KLOR-CON) 20 MEQ tablet Take 1 tablet (20 mEq total) by mouth daily. 1 tab in the morning, 1/2 tab  at bedtime  45 tablet  3  . risperiDONE (RISPERDAL) 2 MG tablet TAKE 1 TABLET (2 MG TOTAL) BY MOUTH DAILY.  30 tablet  2  . sotalol (BETAPACE) 80 MG tablet TAKE ONE TABLET BY MOUTH TWICE DAILY  60 tablet  0  . warfarin (COUMADIN) 5 MG tablet TAKE ONE TABLET BY MOUTH AS DIRECTED  35 tablet  3    No Known Allergies  Past Medical History  Diagnosis Date  . Cardiomyopathy     CHF-EF 25 to 30%/global hypokinesis with no known CAD  . Hypertension   . CHF (congestive heart failure)   . Bipolar disorder   . Obesity   . Atrial fibrillation July 2009    a. very difficult to achieve rate control secondary  to tendency to severe hypotension on medication. b. failed direct current cardioversion September 15, 2007. c. started amiodarone September 16, 2007 with spontaneous conversion of sinus rhythm after 72 hour load on September 18, 2007.  . Tobacco abuse   . Chronic anticoagulation   . Mitral regurgitation     moderate to severe; not an ideal candidate for MV surgery    Past Surgical History  Procedure Date  . Transesophageal echocardiogram 2011    The EF  is 25 to 30% with global hypokinesis, mild mitral regurgitation. No left atrial appendage clot.     History  Smoking status  . Current Some Day Smoker  Smokeless tobacco  . Not on file    History  Alcohol Use No    Family History  Problem Relation Age of Onset  . Hypertension Other   . Diabetes Other     Review of Systems: The review of systems is per the HPI.  All other systems were reviewed and are negative.  Physical Exam: BP 90/60  Pulse 64  Ht 5\' 4"  (1.626 m)  Wt 189 lb (85.73 kg)  BMI 32.44 kg/m2 Patient is pleasant and in no acute distress. She is bipolar and is crying today. She smells of tobacco. Skin is warm and dry. Color is normal.  HEENT is unremarkable. Normocephalic/atraumatic. PERRL. Sclera are nonicteric. Neck is supple. No masses. No JVD. Lungs are fairly clear. Cardiac exam shows an irregular rhythm. Her rate is  controlled. Soft systolic murmur noted. Abdomen is soft. Extremities are without edema. Gait and ROM are intact. No gross neurologic deficits noted.  LABORATORY DATA: BMET is pending   Assessment / Plan:

## 2011-07-22 NOTE — Patient Instructions (Signed)
Avoid salt  I am going to talk with Abigail Mahoney regarding your medicines  I will see you in a week.  Call the [facility] office at [phone] if you have any questions, problems or concerns.

## 2011-07-22 NOTE — Assessment & Plan Note (Signed)
Rate is controlled. She is on coumadin. For INR tomorrow. She does not wish to have it checked today. No evidence of bleeding, but she is taking NSAIDs and Celebrex. This should be monitored closely.

## 2011-07-22 NOTE — Assessment & Plan Note (Signed)
She is not felt to be an ideal candidate for heart surgery given her psychological issues. Will continue with medical management.

## 2011-07-23 ENCOUNTER — Ambulatory Visit (INDEPENDENT_AMBULATORY_CARE_PROVIDER_SITE_OTHER): Payer: Medicaid Other | Admitting: Pharmacist

## 2011-07-23 ENCOUNTER — Telehealth: Payer: Self-pay | Admitting: *Deleted

## 2011-07-23 DIAGNOSIS — I4891 Unspecified atrial fibrillation: Secondary | ICD-10-CM

## 2011-07-23 DIAGNOSIS — Z7901 Long term (current) use of anticoagulants: Secondary | ICD-10-CM

## 2011-07-23 NOTE — Telephone Encounter (Signed)
ERROR--NT 

## 2011-07-23 NOTE — Telephone Encounter (Signed)
Pt called to reschedule her appt with Norma Fredrickson.   Appt was rescheduled.

## 2011-07-23 NOTE — Telephone Encounter (Signed)
New problem.   Patient calling no information was given, asking for the nurse to call her back.

## 2011-07-29 ENCOUNTER — Ambulatory Visit: Payer: Medicaid Other | Admitting: Nurse Practitioner

## 2011-08-06 ENCOUNTER — Ambulatory Visit (INDEPENDENT_AMBULATORY_CARE_PROVIDER_SITE_OTHER): Payer: Medicaid Other | Admitting: Pharmacist

## 2011-08-06 DIAGNOSIS — Z7901 Long term (current) use of anticoagulants: Secondary | ICD-10-CM

## 2011-08-06 DIAGNOSIS — I4891 Unspecified atrial fibrillation: Secondary | ICD-10-CM

## 2011-08-08 ENCOUNTER — Ambulatory Visit: Payer: Medicaid Other | Admitting: Nurse Practitioner

## 2011-08-08 ENCOUNTER — Other Ambulatory Visit: Payer: Self-pay | Admitting: Cardiovascular Disease

## 2011-08-12 ENCOUNTER — Ambulatory Visit (INDEPENDENT_AMBULATORY_CARE_PROVIDER_SITE_OTHER): Payer: Medicaid Other | Admitting: Cardiovascular Disease

## 2011-08-12 ENCOUNTER — Encounter: Payer: Self-pay | Admitting: Cardiovascular Disease

## 2011-08-12 ENCOUNTER — Ambulatory Visit (INDEPENDENT_AMBULATORY_CARE_PROVIDER_SITE_OTHER): Payer: Medicaid Other

## 2011-08-12 VITALS — BP 100/70 | HR 64 | Ht 62.0 in | Wt 183.1 lb

## 2011-08-12 DIAGNOSIS — Z7901 Long term (current) use of anticoagulants: Secondary | ICD-10-CM

## 2011-08-12 DIAGNOSIS — I10 Essential (primary) hypertension: Secondary | ICD-10-CM

## 2011-08-12 DIAGNOSIS — I4891 Unspecified atrial fibrillation: Secondary | ICD-10-CM

## 2011-08-12 DIAGNOSIS — F319 Bipolar disorder, unspecified: Secondary | ICD-10-CM

## 2011-08-12 DIAGNOSIS — F17200 Nicotine dependence, unspecified, uncomplicated: Secondary | ICD-10-CM

## 2011-08-12 LAB — POCT INR: INR: 3.3

## 2011-08-12 NOTE — Assessment & Plan Note (Signed)
Stable failed DCC.  No long pauses or syncope f/U coumadin clinic today.

## 2011-08-12 NOTE — Assessment & Plan Note (Signed)
Labile but stable  Living independantly.  F/U primary.  Mother just had reconstructive knee surgery and cant drive which is hard for Joell since she was a source of transportatiion

## 2011-08-12 NOTE — Progress Notes (Signed)
Patient ID: Abigail Mahoney, female   DOB: [birth year], 56 y.o.   MRN: [id number] Ms. Soler is seen today for a work in visit. She is seen for Dr. Eden Emms. She is a 56 year old black female with bipolar disorder and emotional lability. She has a nonischemic CM with last echo showing EF 25 to 30%. She is on beta blocker, ACE, digoxin and Lasix. She has a nurse Marylou Mccoy [phone]) who fills her pill box. She has atrial fib and has been on chronic coumadin. She has failed with cardioversion in the past.   BP is hard to take and on low side but no postural symptoms.  Still smoking with no motivation to quit.  Ambulates with cane  ROS: Denies fever, malais, weight loss, blurry vision, decreased visual acuity, cough, sputum, SOB, hemoptysis, pleuritic pain, palpitaitons, heartburn, abdominal pain, melena, lower extremity edema, claudication, or rash.  All other systems reviewed and negative  General: Affect appropriate Chroically ill black female teary eyed HEENT: normal Neck supple with no adenopathy JVP normal no bruits no thyromegaly Lungs clear with no wheezing and good diaphragmatic motion Heart:  S1/S2 no murmur, no rub, gallop or click PMI normal Abdomen: benighn, BS positve, no tenderness, no AAA no bruit.  No HSM or HJR Distal pulses intact with no bruits No edema Neuro non-focal Skin warm and dry No muscular weakness   Current Outpatient Prescriptions  Medication Sig Dispense Refill  . benztropine (COGENTIN) 0.5 MG tablet TAKE 1 TABLET (0.5 MG TOTAL) BY MOUTH 2 (TWO) TIMES DAILY.  60 tablet  2  . celecoxib (CELEBREX) 200 MG capsule Take 1 capsule (200 mg total) by mouth daily.      Marland Kitchen desonide (DESOWEN) 0.05 % cream Apply to face prn  30 g  0  . digoxin (LANOXIN) 0.125 MG tablet TAKE ONE TABLET BY MOUTH DAILY  30 tablet  0  . divalproex (DEPAKOTE) 500 MG DR tablet TAKE 1 TABLET (500 MG TOTAL) BY MOUTH 2 (TWO) TIMES DAILY.  60 tablet  2  . furosemide (LASIX) 40 MG tablet TAKE  ONE TABLET BY MOUTH TWICE DAILY  60 tablet  0  . ibuprofen (ADVIL) 200 MG tablet Take 1 tablet (200 mg total) by mouth daily.      Marland Kitchen lisinopril (PRINIVIL,ZESTRIL) 5 MG tablet TAKE ONE TABLET BY MOUTH DAILY  30 tablet  0  . metoprolol succinate (TOPROL-XL) 100 MG 24 hr tablet 100 mg daily. Take 50mg  with or immediately following a meal.      . potassium chloride SA (K-DUR,KLOR-CON) 20 MEQ tablet Take 1 tablet (20 mEq total) by mouth daily. 1 tab in the morning, 1/2 tab at bedtime  45 tablet  3  . risperiDONE (RISPERDAL) 2 MG tablet TAKE 1 TABLET (2 MG TOTAL) BY MOUTH DAILY.  30 tablet  2  . sotalol (BETAPACE) 80 MG tablet TAKE ONE TABLET BY MOUTH TWICE DAILY  60 tablet  0  . warfarin (COUMADIN) 5 MG tablet TAKE ONE TABLET BY MOUTH AS DIRECTED  35 tablet  3    Allergies  Review of patient's allergies indicates no known allergies.  Electrocardiogram:  Assessment and Plan

## 2011-08-12 NOTE — Patient Instructions (Signed)
Your physician wants you to follow-up in:  6 MONTHS WITH DR NISHAN  You will receive a reminder letter in the mail two months in advance. If you don't receive a letter, please call our office to schedule the follow-up appointment. Your physician recommends that you continue on your current medications as directed. Please refer to the Current Medication list given to you today. 

## 2011-08-12 NOTE — Assessment & Plan Note (Signed)
Well controlled.  Continue current medications and low sodium Dash type diet.    

## 2011-08-12 NOTE — Assessment & Plan Note (Signed)
Limited no motivation or insight to quit Yearly CXR

## 2011-08-26 ENCOUNTER — Ambulatory Visit (INDEPENDENT_AMBULATORY_CARE_PROVIDER_SITE_OTHER): Payer: Medicaid Other | Admitting: Pharmacist

## 2011-08-26 DIAGNOSIS — Z7901 Long term (current) use of anticoagulants: Secondary | ICD-10-CM

## 2011-08-26 DIAGNOSIS — I4891 Unspecified atrial fibrillation: Secondary | ICD-10-CM

## 2011-09-05 ENCOUNTER — Other Ambulatory Visit: Payer: Self-pay | Admitting: Cardiovascular Disease

## 2011-09-16 ENCOUNTER — Ambulatory Visit (INDEPENDENT_AMBULATORY_CARE_PROVIDER_SITE_OTHER): Payer: Medicaid Other | Admitting: *Deleted

## 2011-09-16 DIAGNOSIS — I4891 Unspecified atrial fibrillation: Secondary | ICD-10-CM

## 2011-09-16 DIAGNOSIS — Z7901 Long term (current) use of anticoagulants: Secondary | ICD-10-CM

## 2011-09-23 ENCOUNTER — Ambulatory Visit (INDEPENDENT_AMBULATORY_CARE_PROVIDER_SITE_OTHER): Payer: Medicaid Other | Admitting: Physician Assistant

## 2011-09-23 ENCOUNTER — Encounter (HOSPITAL_COMMUNITY): Payer: Self-pay | Admitting: Physician Assistant

## 2011-09-23 DIAGNOSIS — F319 Bipolar disorder, unspecified: Secondary | ICD-10-CM

## 2011-09-23 NOTE — Progress Notes (Signed)
   [facility] Behavioral Health Follow-up Outpatient Visit  Abigail Mahoney 06-29-1955  Date: 09/23/2011   Subjective: [name] presents today with her mother to followup on her treatment for bipolar disorder. She continues to live on her own, although she has been certified for a nursing facility. Her mother has been working with a Child psychotherapist at [employer] trying to get her placed, but progress has been very difficult. Currently they are planning to enroll her in a daytime program at [employer] and [employer]. In the meantime, she is taking pottery classes at a church on [address]. [name] endorses that her appetite has been poor, and she has lost approximately 55 pounds. She also continues to cry easily and frequently. She reports she is sleeping well. She denies any suicidal or homicidal ideation. She denies any auditory or visual hallucinations.  There were no vitals filed for this visit.  Mental Status Examination  Appearance: Fairly groomed and casually dressed Alert: Yes Attention: good  Cooperative: Yes Eye Contact: Good Speech: Garbled yet coherent Psychomotor Activity: Normal Memory/Concentration: Intact Oriented: person, place, time/date and situation Mood: Depressed Affect: Labile Thought Processes and Associations: Logical Fund of Knowledge: Fair Thought Content: Normal Insight: Poor Judgement: Fair  Diagnosis: Bipolar disorder, currently depressed  Treatment Plan: We will continue her Cogentin 0.5 mg twice daily, Depakote 500 mg twice daily, and Risperdal 2 mg at bedtime. She will return for followup in 2 months.  Ica Daye, PA-C

## 2011-10-02 ENCOUNTER — Ambulatory Visit (INDEPENDENT_AMBULATORY_CARE_PROVIDER_SITE_OTHER): Payer: Medicaid Other | Admitting: Pharmacist

## 2011-10-02 DIAGNOSIS — I4891 Unspecified atrial fibrillation: Secondary | ICD-10-CM

## 2011-10-02 DIAGNOSIS — Z7901 Long term (current) use of anticoagulants: Secondary | ICD-10-CM

## 2011-10-06 ENCOUNTER — Other Ambulatory Visit: Payer: Self-pay | Admitting: Cardiovascular Disease

## 2011-10-06 ENCOUNTER — Other Ambulatory Visit (HOSPITAL_COMMUNITY): Payer: Self-pay | Admitting: Physician Assistant

## 2011-10-23 ENCOUNTER — Ambulatory Visit (INDEPENDENT_AMBULATORY_CARE_PROVIDER_SITE_OTHER): Payer: Medicaid Other

## 2011-10-23 DIAGNOSIS — Z7901 Long term (current) use of anticoagulants: Secondary | ICD-10-CM

## 2011-10-23 DIAGNOSIS — I4891 Unspecified atrial fibrillation: Secondary | ICD-10-CM

## 2011-11-06 ENCOUNTER — Ambulatory Visit (INDEPENDENT_AMBULATORY_CARE_PROVIDER_SITE_OTHER): Payer: Medicaid Other | Admitting: *Deleted

## 2011-11-06 DIAGNOSIS — I4891 Unspecified atrial fibrillation: Secondary | ICD-10-CM

## 2011-11-06 DIAGNOSIS — Z7901 Long term (current) use of anticoagulants: Secondary | ICD-10-CM

## 2011-11-06 LAB — POCT INR: INR: 2.7

## 2011-11-18 ENCOUNTER — Ambulatory Visit (HOSPITAL_COMMUNITY): Payer: Self-pay | Admitting: Physician Assistant

## 2011-11-27 ENCOUNTER — Ambulatory Visit (INDEPENDENT_AMBULATORY_CARE_PROVIDER_SITE_OTHER): Payer: Medicaid Other | Admitting: *Deleted

## 2011-11-27 DIAGNOSIS — Z7901 Long term (current) use of anticoagulants: Secondary | ICD-10-CM

## 2011-11-27 DIAGNOSIS — I4891 Unspecified atrial fibrillation: Secondary | ICD-10-CM

## 2011-11-27 LAB — POCT INR: INR: 1.7

## 2011-12-11 ENCOUNTER — Other Ambulatory Visit: Payer: Self-pay | Admitting: Cardiovascular Disease

## 2011-12-11 ENCOUNTER — Ambulatory Visit (INDEPENDENT_AMBULATORY_CARE_PROVIDER_SITE_OTHER): Payer: Medicaid Other | Admitting: *Deleted

## 2011-12-11 ENCOUNTER — Other Ambulatory Visit (HOSPITAL_COMMUNITY): Payer: Self-pay | Admitting: *Deleted

## 2011-12-11 ENCOUNTER — Other Ambulatory Visit: Payer: Self-pay | Admitting: *Deleted

## 2011-12-11 DIAGNOSIS — Z7901 Long term (current) use of anticoagulants: Secondary | ICD-10-CM

## 2011-12-11 DIAGNOSIS — I4891 Unspecified atrial fibrillation: Secondary | ICD-10-CM

## 2011-12-11 DIAGNOSIS — F319 Bipolar disorder, unspecified: Secondary | ICD-10-CM

## 2011-12-11 LAB — POCT INR: INR: 5.8

## 2011-12-11 MED ORDER — BENZTROPINE MESYLATE 0.5 MG PO TABS: 0.5000 mg | ORAL_TABLET | Freq: Two times a day (BID) | ORAL | Status: DC

## 2011-12-11 MED ORDER — DIVALPROEX SODIUM 500 MG PO DR TAB: 500.0000 mg | DELAYED_RELEASE_TABLET | Freq: Two times a day (BID) | ORAL | Status: DC

## 2011-12-11 MED ORDER — WARFARIN SODIUM 5 MG PO TABS: 5.0000 mg | ORAL_TABLET | ORAL | Status: DC

## 2011-12-11 MED ORDER — FUROSEMIDE 40 MG PO TABS: 40.0000 mg | ORAL_TABLET | Freq: Two times a day (BID) | ORAL | Status: DC

## 2011-12-11 MED ORDER — RISPERIDONE 2 MG PO TABS: 2.0000 mg | ORAL_TABLET | Freq: Every day | ORAL | Status: DC

## 2011-12-11 NOTE — Telephone Encounter (Signed)
LEFT MESSAGE ONCE AGAIN. NOTE  SISTER NOT LISTED  TO HAVE MED INFO ONLY MOM LISTED IN EMERGENCY CONTACT   .Zack Seal

## 2011-12-11 NOTE — Telephone Encounter (Signed)
LMTCB, NOTE  SISTER NOT LISTED ON RELEASE OF MED INFO  ONLY MOTHER .Zack Seal

## 2011-12-11 NOTE — Telephone Encounter (Signed)
Pt sister is very concerned because pt is loosing weight very fast and they want to talk to someone regarding this. Maybe she can be put on something that can help with her appetite or maybe be put on something other than warfarin because they know this is a side effect of this medication

## 2011-12-11 NOTE — Telephone Encounter (Signed)
She rtned your call/lg

## 2011-12-12 ENCOUNTER — Other Ambulatory Visit: Payer: Self-pay | Admitting: *Deleted

## 2011-12-12 MED ORDER — DIGOXIN 125 MCG PO TABS: 0.1250 mg | ORAL_TABLET | Freq: Every day | ORAL | Status: DC

## 2011-12-12 MED ORDER — POTASSIUM CHLORIDE CRYS ER 20 MEQ PO TBCR: EXTENDED_RELEASE_TABLET | ORAL | Status: DC

## 2011-12-12 MED ORDER — FUROSEMIDE 40 MG PO TABS: 40.0000 mg | ORAL_TABLET | Freq: Two times a day (BID) | ORAL | Status: AC

## 2011-12-12 NOTE — Telephone Encounter (Signed)
Fax Received. Refill Completed. Abigail Mahoney (R.M.A)   

## 2011-12-12 NOTE — Telephone Encounter (Signed)
Attempted to call pt 3 times--once she hung up--once i got [facility], and finally on third try Abigail Mahoney piced up and i informed her digoxin medication was called to her pharmacy and she could pic up--pt agrees

## 2011-12-18 ENCOUNTER — Ambulatory Visit (INDEPENDENT_AMBULATORY_CARE_PROVIDER_SITE_OTHER): Payer: Medicaid Other | Admitting: *Deleted

## 2011-12-18 DIAGNOSIS — I4891 Unspecified atrial fibrillation: Secondary | ICD-10-CM

## 2011-12-18 DIAGNOSIS — Z7901 Long term (current) use of anticoagulants: Secondary | ICD-10-CM

## 2012-01-01 ENCOUNTER — Ambulatory Visit (INDEPENDENT_AMBULATORY_CARE_PROVIDER_SITE_OTHER): Payer: Medicaid Other | Admitting: *Deleted

## 2012-01-01 DIAGNOSIS — Z7901 Long term (current) use of anticoagulants: Secondary | ICD-10-CM

## 2012-01-01 DIAGNOSIS — I4891 Unspecified atrial fibrillation: Secondary | ICD-10-CM

## 2012-01-01 LAB — POCT INR: INR: 2.1

## 2012-01-09 ENCOUNTER — Other Ambulatory Visit: Payer: Self-pay | Admitting: *Deleted

## 2012-01-09 MED ORDER — LISINOPRIL 5 MG PO TABS: ORAL_TABLET | ORAL | Status: DC

## 2012-01-15 ENCOUNTER — Ambulatory Visit (INDEPENDENT_AMBULATORY_CARE_PROVIDER_SITE_OTHER): Payer: Medicaid Other | Admitting: *Deleted

## 2012-01-15 DIAGNOSIS — Z7901 Long term (current) use of anticoagulants: Secondary | ICD-10-CM

## 2012-01-15 DIAGNOSIS — I4891 Unspecified atrial fibrillation: Secondary | ICD-10-CM

## 2012-01-15 LAB — POCT INR: INR: 2.1

## 2012-01-20 ENCOUNTER — Ambulatory Visit (INDEPENDENT_AMBULATORY_CARE_PROVIDER_SITE_OTHER): Payer: Medicaid Other | Admitting: Physician Assistant

## 2012-01-20 DIAGNOSIS — F319 Bipolar disorder, unspecified: Secondary | ICD-10-CM

## 2012-01-20 NOTE — Progress Notes (Signed)
   [facility] Behavioral Health Follow-up Outpatient Visit  Abigail Mahoney 12/25/1955  Date: 01/20/2012   Subjective: Abigail Mahoney presents today with her mother to followup on her treatment for bipolar disorder. She states that she loves her day program, and her mother reports that she is at [facility] 3 days a week from 8-1. Mother reports that she'll has lost about 10 pounds, and has a very poor appetite. Also she reports that she'll his hygiene is poor [place] he is reminded 2 days and rash her teeth. [name] reports that her mood has been "pretty good." Mother reports she continues to cry frequently. Abigail Mahoney denies any suicidal or homicidal ideation. She denies any auditory or visual hallucinations.  Mother is trying to move forward with [facility] [address] [place] in a long-term care facility. She reports that she requires frequent supervision, and someone to administer her medications for her.  There were no vitals filed for this visit.  Mental Status Examination  Appearance: Fairly groomed and casually dressed Alert: Yes Attention: good  Cooperative: Yes Eye Contact: Good Speech: Clear and coherent Psychomotor Activity: Normal Memory/Concentration: Fair Oriented: person, place, time/date and situation Mood: Euthymic Affect: Congruent Thought Processes and Associations: Disorganized Fund of Knowledge: Fair Thought Content: Normal Insight: Poor Judgement: Poor  Diagnosis: Bipolar disorder not otherwise specified  Treatment Plan: We will continue her Depakote DR 500 mg twice daily, Cogentin 0.5 mg twice daily, and Risperdal 2 mg at bedtime. We will complete the adult to form for long-term care placement. She will return for followup in 2 months.  Harlie Buening, PA-C

## 2012-02-03 ENCOUNTER — Telehealth: Payer: Self-pay | Admitting: Cardiovascular Disease

## 2012-02-03 NOTE — Telephone Encounter (Signed)
LEFT MESSAGE FOR CAROLYN CHAFFIN TO CALL BACK NUMBER [phone]  RE  Abigail Mahoney'S METOPROLOL LISTED ON MED LIST AS TAKING 100 MG AND 50 MG DAILY  NOT SURE WHICH IS CORECT PT DOES NOT KNOW PT MOVING TO AN ASSISTED LIVING  AND  MEDICAL DIRECTOR DR METAWA?? DR'S CELL NUMBER IS [phone]  TEXT HER  ON  CORRECT DOSE./CY

## 2012-02-03 NOTE — Telephone Encounter (Signed)
New problem:    clarification  On Metoprolol 100 mg  & Betapace 80 mg.

## 2012-02-04 NOTE — Telephone Encounter (Signed)
Abigail Mahoney states that pt's pcp has answered all questions regarding pt's medications.

## 2012-02-04 NOTE — Telephone Encounter (Signed)
F/u   Returning call back to nurse Wynona Canes

## 2012-02-06 ENCOUNTER — Ambulatory Visit (INDEPENDENT_AMBULATORY_CARE_PROVIDER_SITE_OTHER): Payer: Medicaid Other

## 2012-02-06 DIAGNOSIS — Z7901 Long term (current) use of anticoagulants: Secondary | ICD-10-CM

## 2012-02-06 DIAGNOSIS — I4891 Unspecified atrial fibrillation: Secondary | ICD-10-CM

## 2012-02-06 LAB — POCT INR: INR: 3.9

## 2012-02-23 ENCOUNTER — Ambulatory Visit: Payer: Self-pay | Admitting: Cardiovascular Disease

## 2012-02-24 ENCOUNTER — Ambulatory Visit (INDEPENDENT_AMBULATORY_CARE_PROVIDER_SITE_OTHER): Payer: Medicaid Other | Admitting: Cardiovascular Disease

## 2012-02-24 ENCOUNTER — Encounter: Payer: Self-pay | Admitting: Cardiovascular Disease

## 2012-02-24 VITALS — BP 110/68 | HR 114 | Ht 62.0 in | Wt 176.0 lb

## 2012-02-24 DIAGNOSIS — F17200 Nicotine dependence, unspecified, uncomplicated: Secondary | ICD-10-CM

## 2012-02-24 DIAGNOSIS — I10 Essential (primary) hypertension: Secondary | ICD-10-CM

## 2012-02-24 DIAGNOSIS — F319 Bipolar disorder, unspecified: Secondary | ICD-10-CM

## 2012-02-24 DIAGNOSIS — I509 Heart failure, unspecified: Secondary | ICD-10-CM

## 2012-02-24 NOTE — Assessment & Plan Note (Signed)
Moved to new assisted living by mother. Seems to like it.  Doing well

## 2012-02-24 NOTE — Progress Notes (Signed)
Patient ID: Abigail Mahoney, female   DOB: Jul 24, 1955, 56 y.o.   MRN: [id number] Ms. Tissue is seen today for a work in visit. She is seen for Dr. Eden Emms. She is a 56 year old black female with bipolar disorder and emotional lability. She has a nonischemic CM with last echo showing EF 25 to 30%. She is on beta blocker, ACE, digoxin and Lasix. She has a nurse Marylou Mccoy [phone]) who fills her pill box. She has atrial fib and has been on chronic coumadin. She has failed with cardioversion in the past.   BP is hard to take and on low side but no postural symptoms. Still smoking with no motivation to quit. Ambulates with cane  INR high about two weeks ago will recheck today  Down to two cigarettes / day  ROS: Denies fever, malais, weight loss, blurry vision, decreased visual acuity, cough, sputum, SOB, hemoptysis, pleuritic pain, palpitaitons, heartburn, abdominal pain, melena, lower extremity edema, claudication, or rash.  All other systems reviewed and negative  General: Affect appropriate Overweight black female with poor dentition HEENT: normal Neck supple with no adenopathy JVP normal no bruits no thyromegaly Lungs clear with no wheezing and good diaphragmatic motion Heart:  S1/S2 no murmur, no rub, gallop or click PMI normal Abdomen: benighn, BS positve, no tenderness, no AAA no bruit.  No HSM or HJR Distal pulses intact with no bruits No edema Neuro non-focal Skin warm and dry No muscular weakness   Current Outpatient Prescriptions  Medication Sig Dispense Refill  . benztropine (COGENTIN) 0.5 MG tablet Take 1 tablet (0.5 mg total) by mouth 2 (two) times daily.  60 tablet  1  . celecoxib (CELEBREX) 200 MG capsule Take 1 capsule (200 mg total) by mouth daily.      Marland Kitchen desonide (DESOWEN) 0.05 % cream Apply to face prn  30 g  0  . digoxin (LANOXIN) 0.125 MG tablet Take 1 tablet (0.125 mg total) by mouth daily.  30 tablet  5  . divalproex (DEPAKOTE) 500 MG DR tablet Take 1  tablet (500 mg total) by mouth 2 (two) times daily.  60 tablet  1  . furosemide (LASIX) 40 MG tablet Take 1 tablet (40 mg total) by mouth 2 (two) times daily.  60 tablet  3  . ibuprofen (ADVIL) 200 MG tablet Take 1 tablet (200 mg total) by mouth daily.      Marland Kitchen lisinopril (PRINIVIL,ZESTRIL) 5 MG tablet TAKE ONE TABLET BY MOUTH DAILY  30 tablet  12  . metoprolol succinate (TOPROL-XL) 100 MG 24 hr tablet 100 mg daily. Take 50mg  with or immediately following a meal.      . potassium chloride SA (K-DUR,KLOR-CON) 20 MEQ tablet 1 am and 1/2 tab pm  45 tablet  6  . risperiDONE (RISPERDAL) 2 MG tablet Take 1 tablet (2 mg total) by mouth at bedtime.  30 tablet  1  . sotalol (BETAPACE) 80 MG tablet TAKE ONE TABLET BY MOUTH TWICE DAILY  60 tablet  0  . warfarin (COUMADIN) 5 MG tablet Take 1 tablet (5 mg total) by mouth as directed.  35 tablet  3    Allergies  Review of patient's allergies indicates no known allergies.  Electrocardiogram:  08/07/10 SR rate 82 lots of artifact lateral T wave changes dig effect  Assessment and Plan

## 2012-02-24 NOTE — Patient Instructions (Signed)
Your physician wants you to follow-up in:  6 MONTHS WITH DR NISHAN  You will receive a reminder letter in the mail two months in advance. If you don't receive a letter, please call our office to schedule the follow-up appointment. Your physician recommends that you continue on your current medications as directed. Please refer to the Current Medication list given to you today. 

## 2012-02-24 NOTE — Assessment & Plan Note (Signed)
Improving continue to try to cut back

## 2012-02-24 NOTE — Assessment & Plan Note (Signed)
Euvolemic and functional class 2  STable

## 2012-02-24 NOTE — Assessment & Plan Note (Signed)
Well controlled.  Continue current medications and low sodium Dash type diet.    

## 2012-03-05 NOTE — Addendum Note (Signed)
Addended by: Micki Riley C on: 03/05/2012 04:50 PM   Modules accepted: Orders

## 2012-03-30 ENCOUNTER — Ambulatory Visit (HOSPITAL_COMMUNITY): Payer: Self-pay | Admitting: Physician Assistant

## 2012-04-06 ENCOUNTER — Ambulatory Visit (HOSPITAL_COMMUNITY): Payer: Self-pay | Admitting: Physician Assistant

## 2012-04-07 ENCOUNTER — Ambulatory Visit (HOSPITAL_COMMUNITY): Payer: Self-pay | Admitting: Physician Assistant

## 2012-04-27 ENCOUNTER — Ambulatory Visit (INDEPENDENT_AMBULATORY_CARE_PROVIDER_SITE_OTHER): Payer: Medicaid Other | Admitting: Physician Assistant

## 2012-04-27 DIAGNOSIS — F319 Bipolar disorder, unspecified: Secondary | ICD-10-CM

## 2012-04-27 NOTE — Progress Notes (Signed)
   [facility] Follow-up Outpatient Visit  Abigail Mahoney 1955-08-15  Date: 04/27/2012   Subjective: [name] presents today with her mother to followup on her treatment for bipolar disorder. She announces that she is now living at [facility] assisted living facility for about 3 months. She likes that facility. She is making new friends. She is sleeping well. She is eating 3 good meals daily. She reports that her mood has been stable. She continues to go to her day program, [facility], 4 days a week. She wishes her son would come and visit her more often. She denies any suicidal or homicidal ideation. She denies any auditory or visual hallucinations.  There were no vitals filed for this visit.  Mental Status Examination  Appearance: Well groomed and casually dressed Alert: Yes Attention: good  Cooperative: Yes Eye Contact: Good Speech: Clear and coherent Psychomotor Activity: Normal Memory/Concentration: Intact Oriented: person, place, time/date and situation Mood: Labile Affect: Congruent Thought Processes and Associations: Logical Fund of Knowledge: Fair Thought Content: Normal Insight: Fair Judgement: Fair  Diagnosis: Bipolar disorder NOS  Treatment Plan: We will continue her Risperdal 2 mg at bedtime, Depakote DR 500 mg twice daily, and Cogentin 0.5 mg twice daily. She will return for followup in 3 months.  Tarun Patchell, PA-C

## 2012-05-30 IMAGING — CR DG CHEST 1V PORT
1 series · 1 of 1 positions shown · non-contrast
Comparison: 09/14/2007.

CLINICAL DATA: Short of breath.

PORTABLE CHEST - 1 VIEW

[AP]
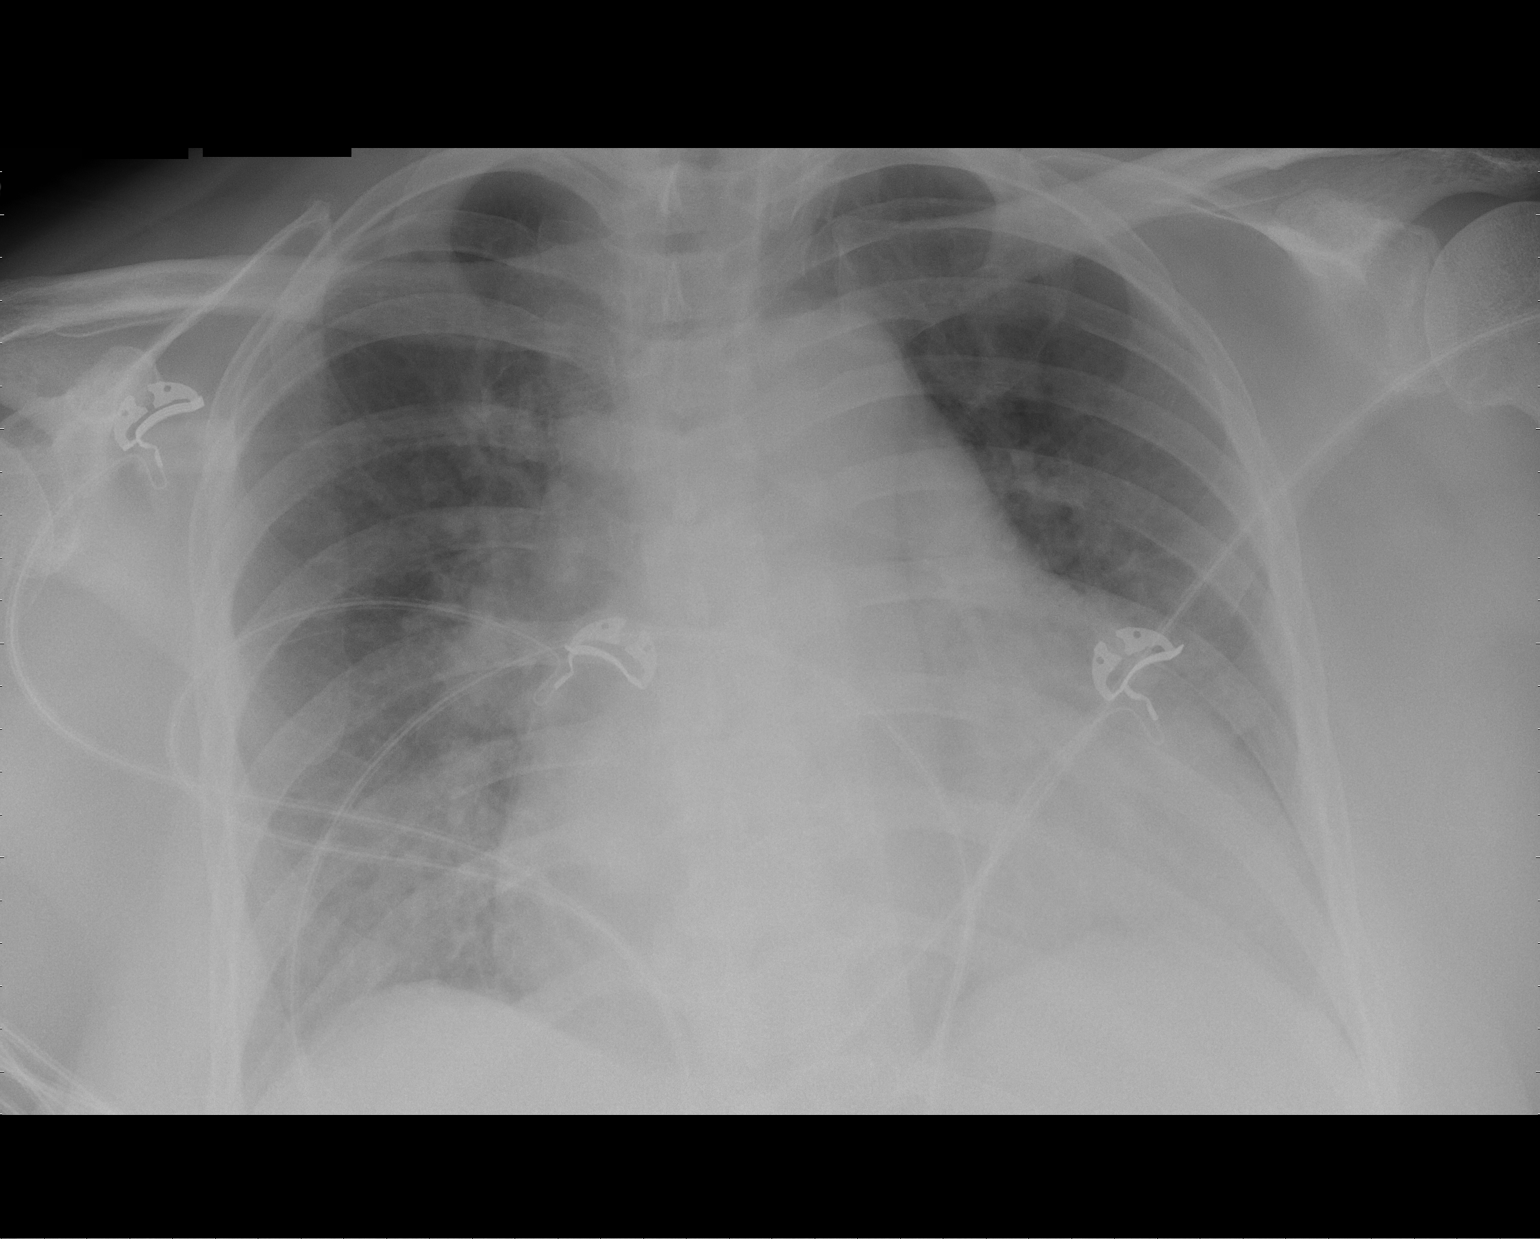

[1 of 1 positions shown; findings below may reference images not displayed]

FINDINGS: The larger of the cardiopericardial silhouette compatible
with cardiomegaly.  Trachea midline.  Perihilar and basilar
predominant airspace disease is present compatible with pulmonary
edema.  No effusion.  Lung volumes are low. Monitoring leads are
projected over the chest.
IMPRESSION: Moderate CHF.

## 2012-06-28 ENCOUNTER — Encounter: Payer: Self-pay | Admitting: *Deleted

## 2012-07-27 ENCOUNTER — Ambulatory Visit (HOSPITAL_COMMUNITY): Payer: Self-pay | Admitting: Physician Assistant

## 2012-08-23 ENCOUNTER — Encounter: Payer: Self-pay | Admitting: Cardiovascular Disease

## 2012-09-09 ENCOUNTER — Ambulatory Visit (HOSPITAL_COMMUNITY): Payer: Self-pay | Admitting: Physician Assistant

## 2012-11-25 ENCOUNTER — Ambulatory Visit (HOSPITAL_COMMUNITY): Payer: Self-pay | Admitting: Physician Assistant

## 2012-12-03 ENCOUNTER — Ambulatory Visit: Payer: Self-pay | Admitting: Cardiovascular Disease

## 2012-12-09 ENCOUNTER — Ambulatory Visit (HOSPITAL_COMMUNITY): Payer: Self-pay | Admitting: Physician Assistant

## 2012-12-23 ENCOUNTER — Ambulatory Visit (INDEPENDENT_AMBULATORY_CARE_PROVIDER_SITE_OTHER): Payer: Medicaid Other | Admitting: Cardiovascular Disease

## 2012-12-23 VITALS — BP 100/70 | HR 68 | Wt 185.0 lb

## 2012-12-23 DIAGNOSIS — I509 Heart failure, unspecified: Secondary | ICD-10-CM

## 2012-12-23 DIAGNOSIS — I10 Essential (primary) hypertension: Secondary | ICD-10-CM

## 2012-12-23 DIAGNOSIS — F319 Bipolar disorder, unspecified: Secondary | ICD-10-CM

## 2012-12-23 DIAGNOSIS — F17200 Nicotine dependence, unspecified, uncomplicated: Secondary | ICD-10-CM

## 2012-12-23 NOTE — Progress Notes (Signed)
Patient ID: Abigail Mahoney, female   DOB: 07/19/1955, 57 y.o.   MRN: [id number] [name] is seen today for a work in visit. She is seen for Dr. Eden Emms. She is a 57 year old black female with bipolar disorder and emotional lability. She has a nonischemic CM with last echo showing EF 25 to 30%. She is on beta blocker, ACE, digoxin and Lasix. She has a nurse Abigail Mahoney [phone]) who fills her pill box. She has atrial fib and has been on chronic coumadin. She has failed with cardioversion in the past.  BP is hard to take and on low side but no postural symptoms. Still smoking with no motivation to quit. Ambulates with cane   Down to one cigarettes / day  Sister brother and mom see her regularly  ROS: Denies fever, malais, weight loss, blurry vision, decreased visual acuity, cough, sputum, SOB, hemoptysis, pleuritic pain, palpitaitons, heartburn, abdominal pain, melena, lower extremity edema, claudication, or rash.  All other systems reviewed and negative  General: Affect appropriate Overweigth excitable black female  HEENT: normal  Poor dentition Neck supple with no adenopathy JVP normal no bruits no thyromegaly Lungs clear with no wheezing and good diaphragmatic motion Heart:  S1/S2 no murmur, no rub, gallop or click PMI normal Abdomen: benighn, BS positve, no tenderness, no AAA no bruit.  No HSM or HJR Distal pulses intact with no bruits No edema Neuro non-focal Skin warm and dry No muscular weakness   Current Outpatient Prescriptions  Medication Sig Dispense Refill  . benztropine (COGENTIN) 0.5 MG tablet Take 1 tablet (0.5 mg total) by mouth 2 (two) times daily.  60 tablet  1  . desonide (DESOWEN) 0.05 % cream Apply to face prn  30 g  0  . digoxin (LANOXIN) 0.125 MG tablet Take 1 tablet (0.125 mg total) by mouth daily.  30 tablet  5  . divalproex (DEPAKOTE) 500 MG DR tablet Take 1 tablet (500 mg total) by mouth 2 (two) times daily.  60 tablet  1  . furosemide (LASIX) 40 MG  tablet Take 1 tablet (40 mg total) by mouth 2 (two) times daily.  60 tablet  3  . ibuprofen (ADVIL) 200 MG tablet Take 1 tablet (200 mg total) by mouth daily.      Marland Kitchen lisinopril (PRINIVIL,ZESTRIL) 5 MG tablet TAKE ONE TABLET BY MOUTH DAILY  30 tablet  12  . metoprolol succinate (TOPROL-XL) 100 MG 24 hr tablet 100 mg daily. Take 50mg  with or immediately following a meal.      . potassium chloride SA (K-DUR,KLOR-CON) 20 MEQ tablet Take 20 mEq by mouth 2 (two) times daily. 1 am and 1/2 tab pm      . risperiDONE (RISPERDAL) 2 MG tablet Take 1 tablet (2 mg total) by mouth at bedtime.  30 tablet  1  . sotalol (BETAPACE) 80 MG tablet TAKE ONE TABLET BY MOUTH TWICE DAILY  60 tablet  0  . warfarin (COUMADIN) 5 MG tablet Take 1 tablet (5 mg total) by mouth as directed.  35 tablet  3   No current facility-administered medications for this visit.    Allergies  Review of patient's allergies indicates no known allergies.  Electrocardiogram:  02/24/12 Afib rate 114 nonspecfic ST/T wave changes  Assessment and Plan

## 2012-12-23 NOTE — Patient Instructions (Signed)
Your physician wants you to follow-up in:  6 MONTHS WITH DR NISHAN  You will receive a reminder letter in the mail two months in advance. If you don't receive a letter, please call our office to schedule the follow-up appointment. Your physician recommends that you continue on your current medications as directed. Please refer to the Current Medication list given to you today. 

## 2012-12-23 NOTE — Assessment & Plan Note (Signed)
Euvolemic with no recent hospitalization Continue current meds

## 2012-12-23 NOTE — Assessment & Plan Note (Signed)
Well controlled.  Continue current medications and low sodium Dash type diet.    

## 2012-12-23 NOTE — Assessment & Plan Note (Signed)
Stable in group home with good assist and family support She seems happy

## 2012-12-23 NOTE — Assessment & Plan Note (Signed)
Doing much better First time she has not smelled like nicotine in office

## 2013-01-12 ENCOUNTER — Ambulatory Visit (HOSPITAL_COMMUNITY): Payer: Self-pay | Admitting: Psychiatry

## 2013-02-01 ENCOUNTER — Ambulatory Visit (INDEPENDENT_AMBULATORY_CARE_PROVIDER_SITE_OTHER): Payer: Medicaid Other | Admitting: Psychiatry

## 2013-02-01 ENCOUNTER — Encounter (HOSPITAL_COMMUNITY): Payer: Self-pay | Admitting: Psychiatry

## 2013-02-01 ENCOUNTER — Encounter (INDEPENDENT_AMBULATORY_CARE_PROVIDER_SITE_OTHER): Payer: Self-pay

## 2013-02-01 VITALS — BP 84/65 | HR 103 | Ht 62.0 in | Wt 185.0 lb

## 2013-02-01 DIAGNOSIS — F319 Bipolar disorder, unspecified: Secondary | ICD-10-CM

## 2013-02-01 MED ORDER — DIVALPROEX SODIUM 500 MG PO DR TAB: 500.0000 mg | DELAYED_RELEASE_TABLET | Freq: Two times a day (BID) | ORAL | Status: DC

## 2013-02-01 MED ORDER — RISPERIDONE 2 MG PO TABS: 2.0000 mg | ORAL_TABLET | Freq: Every day | ORAL | Status: DC

## 2013-02-01 MED ORDER — BENZTROPINE MESYLATE 0.5 MG PO TABS: 0.5000 mg | ORAL_TABLET | Freq: Two times a day (BID) | ORAL | Status: DC

## 2013-02-01 NOTE — Progress Notes (Signed)
[facility] [id number] Progress Note  Abigail Mahoney [id number] 57 y.o.  02/01/2013 11:38 AM  Chief Complaint: I need my medication.  History of Present Illness: She does have a 57 year old female who came for her appointment.  I know Teralyn from her past however recently she has been seeing physician Asst. Jorje Guild who has left the practice.  Patient is compliant with the medication.  She brought a list of medications from her group home.  I reviewed the medication.  It appears that her Risperdal dose has been decreased.  I called the facility and I was told that she recently saw a nurse practitioner who may have decreased the dose to 0.5 mg Risperdal.  Patient admitted some time she has insomnia and hallucination however she denies any suicidal thoughts or homicidal thoughts.  Patient has been stable for a while on her Risperdal 2 mg and Depakote.  She recently moved to a new facility [name].  She likes the facility.  She is able to make some friends.  She has no tremors or shakes.  She has no blood work including Depakote level in a while.  Suicidal Ideation: No Plan Formed: No Patient has means to carry out plan: No  Homicidal Ideation: No Plan Formed: No Patient has means to carry out plan: No  Review of Systems: Psychiatric: Agitation: No Hallucination: Yes Depressed Mood: No Insomnia: Yes Hypersomnia: No Altered Concentration: No Feels Worthless: No Grandiose Ideas: No Belief In Special Powers: No New/Increased Substance Abuse: No Compulsions: No  Neurologic: Headache: No Seizure: No Paresthesias: No  Past Medical Family, Social History: Patient has 1 son and one 41-year-old grandchild.  She is seeing Dr. Bradly Chris for hypertension cardiomyopathy and mitral regurgitation.  She recently moved to a group home.  She has limited social network.    Outpatient Encounter Prescriptions as of 02/01/2013  Medication Sig  . benztropine (COGENTIN) 0.5 MG tablet  Take 1 tablet (0.5 mg total) by mouth 2 (two) times daily.  Marland Kitchen desonide (DESOWEN) 0.05 % cream Apply to face prn  . digoxin (LANOXIN) 0.125 MG tablet Take 1 tablet (0.125 mg total) by mouth daily.  . divalproex (DEPAKOTE) 500 MG DR tablet Take 1 tablet (500 mg total) by mouth 2 (two) times daily.  . [DISCONTINUED] benztropine (COGENTIN) 0.5 MG tablet Take 1 tablet (0.5 mg total) by mouth 2 (two) times daily.  . [DISCONTINUED] divalproex (DEPAKOTE) 500 MG DR tablet Take 1 tablet (500 mg total) by mouth 2 (two) times daily.  . furosemide (LASIX) 40 MG tablet Take 1 tablet (40 mg total) by mouth 2 (two) times daily.  Marland Kitchen ibuprofen (ADVIL) 200 MG tablet Take 1 tablet (200 mg total) by mouth daily.  Marland Kitchen lisinopril (PRINIVIL,ZESTRIL) 5 MG tablet TAKE ONE TABLET BY MOUTH DAILY  . metoprolol succinate (TOPROL-XL) 100 MG 24 hr tablet 100 mg daily. Take 50mg  with or immediately following a meal.  . potassium chloride SA (K-DUR,KLOR-CON) 20 MEQ tablet Take 20 mEq by mouth 2 (two) times daily. 1 am and 1/2 tab pm  . risperiDONE (RISPERDAL) 2 MG tablet Take 1 tablet (2 mg total) by mouth at bedtime.  . sotalol (BETAPACE) 80 MG tablet TAKE ONE TABLET BY MOUTH TWICE DAILY  . warfarin (COUMADIN) 5 MG tablet Take 1 tablet (5 mg total) by mouth as directed.  . [DISCONTINUED] risperiDONE (RISPERDAL) 2 MG tablet Take 1 tablet (2 mg total) by mouth at bedtime.    Past Psychiatric History/Hospitalization(s): The patient has  at least one psychiatric hospitalization in 80`s.  She do not remember the details.  However she denies any suicidal attempt.  She is on Depakote Risperdal and Cogentin since she started coming to this office.  Anxiety: Yes Bipolar Disorder: Yes Depression: Yes Mania: Yes Psychosis: Yes Schizophrenia: No Personality Disorder: No Hospitalization for psychiatric illness: Yes History of Electroconvulsive Shock Therapy: No Prior Suicide Attempts: No  Physical Exam: Constitutional:  BP 84/65   Pulse 103  Ht 5\' 2"  (1.575 m)  Wt 185 lb (83.915 kg)  BMI 33.83 kg/m2  General Appearance: well nourished  Musculoskeletal: Strength & Muscle Tone: decreased Gait & Station: unsteady Patient leans: Front  Psychiatric: Speech (describe rate, volume, coherence, spontaneity, and abnormalities if any): Incoherent at times  Thought Process (describe rate, content, abstract reasoning, and computation): Circumstantial.  Associations: Irrelevant and Loose  Thoughts: Rumination and preoccupied with her own thinking.  Mental Status: Orientation: oriented to person and place Mood & Affect: anxiety and labile affect Attention Span & Concentration: Fair  Medical Decision Making (Choose Three): Established Problem, Stable/Improving (1), Review of Psycho-Social Stressors (1), Review or order clinical lab tests (1), Decision to obtain old records (1), Review and summation of old records (2), Established Problem, Worsening (2), Review of Last Therapy Session (1), Review of Medication Regimen & Side Effects (2) and Review of New Medication or Change in Dosage (2)  Assessment: Axis I: Bipolar disorder  Axis II: Deferred  Axis III:  Past Medical History  Diagnosis Date  . Cardiomyopathy     CHF-EF 25 to 30%/global hypokinesis with no known CAD  . Hypertension   . CHF (congestive heart failure)   . Bipolar disorder   . Obesity   . Atrial fibrillation July 2009    a. very difficult to achieve rate control secondary  to tendency to severe hypotension on medication. b. failed direct current cardioversion September 15, 2007. c. started amiodarone September 16, 2007 with spontaneous conversion of sinus rhythm after 72 hour load on September 18, 2007.  . Tobacco abuse   . Chronic anticoagulation   . Mitral regurgitation     moderate to severe; not an ideal candidate for MV surgery    Axis IV: Mild to moderate  Axis V: 50-55   Plan: I review her symptoms, history, collateral information and current  medication.  It is unclear why her Risperdal was decreased from 2 mg to 0.5 mg.  I spoke to Catering manager at [name] group [facility].  I requested records from the nurse practitioner, recent blood work and I will order a Depakote level which has not done in a while.  I strongly recommended that she should continue with Risperdal 2 mg at bedtime along with Depakote thousand milligram at bedtime.  In the past she has been decompensated when she reduced the dose of Risperdal.  I will see her again in 2 months.  Discussed in detail the risks and benefits of medication.  A prescription of Risperdal 2 mg, Depakote 500 mg 2 at bedtime and Cogentin 0.5 mg at bedtime is given.  I also order CBC, CMP, hemoglobin A1c and Depakote level.Time spent 25 minutes.  More than 50% of the time spent in psychoeducation, counseling and coordination of care.  Discuss safety plan that anytime having active suicidal thoughts or homicidal thoughts then patient need to call 911 or go to the local emergency room.    Arora Coakley T., MD 02/01/2013

## 2013-04-05 ENCOUNTER — Ambulatory Visit (INDEPENDENT_AMBULATORY_CARE_PROVIDER_SITE_OTHER): Payer: Federal, State, Local not specified - Other | Admitting: Psychiatry

## 2013-04-05 ENCOUNTER — Encounter (HOSPITAL_COMMUNITY): Payer: Self-pay | Admitting: Psychiatry

## 2013-04-05 VITALS — BP 83/63 | HR 88 | Ht 62.0 in | Wt 182.0 lb

## 2013-04-05 DIAGNOSIS — F319 Bipolar disorder, unspecified: Secondary | ICD-10-CM

## 2013-04-05 MED ORDER — RISPERIDONE 2 MG PO TABS: 2.0000 mg | ORAL_TABLET | Freq: Every day | ORAL | Status: DC

## 2013-04-05 MED ORDER — DIVALPROEX SODIUM 500 MG PO DR TAB: 500.0000 mg | DELAYED_RELEASE_TABLET | Freq: Two times a day (BID) | ORAL | Status: DC

## 2013-04-05 NOTE — Progress Notes (Signed)
[facility] [id number] Progress Note  Abigail Mahoney [id number] 58 y.o.  04/05/2013 10:38 AM  Chief Complaint: I'm doing better.  I'm sleeping better.    History of Present Illness: Abigail Mahoney came for her followup appointment.  Our last visit we increased Risperdal 2 mg which was decreased by her primary care physician to 0.5 mg and she was experiencing insomnia and hallucination.  We are still waiting a lack of information from her primary care physician .  It is unclear why the dose was decreased.  Patient did not bring the current list of medication however she feels much improvement since the Risperdal dose increase.  She has a good Christmas.  She was able to see her son .  Patient denies any agitation or any anger .  Patient denies any tremors or shakes however she has difficulty walking and she uses cain.  She see Abigail Mahoney for her primary care needs.  Patient admitted much improvement in her paranoia and hallucination since the Risperdal has been increased.  She is also taking Depakote 500 mg twice a day.  She denies any suicidal thoughts or homicidal thoughts.  She's been living in a Abigail Mahoney for more than a year and she enjoys living there.  She is able to make some friends.  She is not drinking alcohol or using any legal substances.  Suicidal Ideation: No Plan Formed: No Patient has means to carry out plan: No  Homicidal Ideation: No Plan Formed: No Patient has means to carry out plan: No  Review of Systems: Psychiatric: Agitation: No Hallucination: No Depressed Mood: No Insomnia: No Hypersomnia: No Altered Concentration: No Feels Worthless: No Grandiose Ideas: No Belief In Special Powers: No New/Increased Substance Abuse: No Compulsions: No  Neurologic: Headache: No Seizure: No Paresthesias: No  Past Medical Family, Social History: Patient has 1 son and one 24-year-old grandchild.  She is seeing Abigail Mahoney for hypertension cardiomyopathy and mitral  regurgitation.  Her primary care physician is Abigail Mahoney.  She is living in a group home.  She has limited social network.    Outpatient Encounter Prescriptions as of 04/05/2013  Medication Sig  . divalproex (DEPAKOTE) 500 MG DR tablet Take 1 tablet (500 mg total) by mouth 2 (two) times daily.  . risperiDONE (RISPERDAL) 2 MG tablet Take 1 tablet (2 mg total) by mouth at bedtime.  . [DISCONTINUED] divalproex (DEPAKOTE) 500 MG DR tablet Take 1 tablet (500 mg total) by mouth 2 (two) times daily.  . [DISCONTINUED] risperiDONE (RISPERDAL) 2 MG tablet Take 1 tablet (2 mg total) by mouth at bedtime.  . benztropine (COGENTIN) 0.5 MG tablet Take 1 tablet (0.5 mg total) by mouth 2 (two) times daily.  Marland Kitchen desonide (DESOWEN) 0.05 % cream Apply to face prn  . digoxin (LANOXIN) 0.125 MG tablet Take 1 tablet (0.125 mg total) by mouth daily.  . furosemide (LASIX) 40 MG tablet Take 1 tablet (40 mg total) by mouth 2 (two) times daily.  Marland Kitchen ibuprofen (ADVIL) 200 MG tablet Take 1 tablet (200 mg total) by mouth daily.  Marland Kitchen lisinopril (PRINIVIL,ZESTRIL) 5 MG tablet TAKE ONE TABLET BY MOUTH DAILY  . metoprolol succinate (TOPROL-XL) 100 MG 24 hr tablet 100 mg daily. Take 50mg  with or immediately following a meal.  . potassium chloride SA (K-DUR,KLOR-CON) 20 MEQ tablet Take 20 mEq by mouth 2 (two) times daily. 1 am and 1/2 tab pm  . sotalol (BETAPACE) 80 MG tablet TAKE ONE TABLET BY MOUTH TWICE DAILY  .  warfarin (COUMADIN) 5 MG tablet Take 1 tablet (5 mg total) by mouth as directed.    Past Psychiatric History/Hospitalization(s): She has at least one psychiatric hospitalization in 80`s.  She do not remember the details.  However she denies any suicidal attempt.  She is on Depakote Risperdal and Cogentin since she started coming to this office.  Anxiety: Yes Bipolar Disorder: Yes Depression: Yes Mania: Yes Psychosis: Yes Schizophrenia: No Personality Disorder: No Hospitalization for psychiatric illness: Yes History of  Electroconvulsive Shock Therapy: No Prior Suicide Attempts: No  Physical Exam: Constitutional:  BP 83/63  Pulse 88  Ht 5\' 2"  (1.575 m)  Wt 182 lb (82.555 kg)  BMI 33.28 kg/m2  General Appearance: well nourished  Musculoskeletal: Strength & Muscle Tone: decreased Gait & Station: unsteady Patient leans: Front, needs cain to help walking.  Psychiatric: Speech (describe rate, volume, coherence, spontaneity, and abnormalities if any): Incoherent and rambling at times  Thought Process (describe rate, content, abstract reasoning, and computation): Circumstantial.  Associations: Loose  Thoughts: Rumination   Mental Status: Orientation: oriented to person and place Mood & Affect: anxiety and labile affect Attention Span & Concentration: Fair  Medical Decision Making (Choose Three): Established Problem, Stable/Improving (1), Review of Psycho-Social Stressors (1), Review or order clinical lab tests (1), Decision to obtain old records (1), Review of Last Therapy Session (1) and Review of Medication Regimen & Side Effects (2)  Assessment: Axis I: Bipolar disorder  Axis II: Deferred  Axis III:  Past Medical History  Diagnosis Date  . Cardiomyopathy     CHF-EF 25 to 30%/global hypokinesis with no known CAD  . Hypertension   . CHF (congestive heart failure)   . Bipolar disorder   . Obesity   . Atrial fibrillation July 2009    a. very difficult to achieve rate control secondary  to tendency to severe hypotension on medication. b. failed direct current cardioversion September 15, 2007. c. started amiodarone September 16, 2007 with spontaneous conversion of sinus rhythm after 72 hour load on September 18, 2007.  . Tobacco abuse   . Chronic anticoagulation   . Mitral regurgitation     moderate to severe; not an ideal candidate for MV surgery    Axis IV: Mild to moderate  Axis V: 50-55   Plan:  I called her group home requesting records but I was told to contact primary care physician.  I  called Dr. Sherilyn CooterHenry trip`s office at [phone] and spoke to nurse Abigail Mahoney regarding collateral information.  She was unaware of why the respiratory was initially reduced from 2 mg to 0.5 mg.  However she did provide the current dose of Risperdal and Depakote which is 2 mg Risperdal at bedtime and Depakote 500 mg twice a day.  Her last Depakote level was 45.3 on November 17.  She will fax the results to us with a current list of medication.  She also promises that she will find out from the nurse practitione about reducing the dose of Risperdal in the past.  I recommended to have her office notes sent to us.  I have provided the fax number of our office.  At this time I will continue Risperdal 2 mg at bedtime suspicion is feeling much better.  I would continue Depakote 500 mg twice a day and Cogentin 2 mg at bedtime.  I discussed in detail the risks and benefits of medication to the patient and recommended to call us back if she has any question or any concern.  Followup in 3 months. Time spent 25 minutes.  More than 50% of the time spent in psychoeducation, counseling and coordination of care.  Discuss safety plan that anytime having active suicidal thoughts or homicidal thoughts then patient need to call 911 or go to the local emergency room.    Jaylene Arrowood T., MD 04/05/2013

## 2013-05-18 ENCOUNTER — Telehealth: Payer: Self-pay | Admitting: Cardiovascular Disease

## 2013-05-18 NOTE — Telephone Encounter (Signed)
Spoke with pt's sister.  Pt having 14 teeth removed at the hospital under anesthesia.  The date has not been scheduled yet.  Per sister Dr Teola Bradley is awaiting cardiac clearance from Dr Eden Emms prior to scheduling.  Also needs to know if pt needs bridging since she is on coumadin for At Fib.  Sister aware I will sent to Dr Eden Emms for review but that he may need to see her in the office prior to giving clearance.  Sister states understanding and will await a call back.

## 2013-05-18 NOTE — Telephone Encounter (Signed)
New message    Patient sister calling stating patient need to have  14 teeth extraction .    Dr. Barbette Merino in [address] sister did not have phone number.

## 2013-05-19 NOTE — Telephone Encounter (Signed)
Ok to have dental work Ok to hold coumadin with no bridge

## 2013-05-20 NOTE — Telephone Encounter (Signed)
THIS PHONE NOTE  ALONG  WITH   CLEARANCE FORM  FAXED  TO  DR  Randa Evens OFFICE .Zack Seal

## 2013-05-23 ENCOUNTER — Telehealth: Payer: Self-pay | Admitting: Cardiovascular Disease

## 2013-05-23 NOTE — Telephone Encounter (Signed)
LAST OFFICE NOTE FAXED TO  JODY   AND ALSO  NOTIFIED [facility]  THAT   PT  MAY  HOLD   COUMADIN  5 DAYS  PRIOR TO EXTRACTIONS./CY

## 2013-05-23 NOTE — Telephone Encounter (Signed)
New message     Receive the medical clearance.    Asking for the latest H&p.    When should she stop coumadin

## 2013-07-05 ENCOUNTER — Ambulatory Visit (INDEPENDENT_AMBULATORY_CARE_PROVIDER_SITE_OTHER): Payer: Federal, State, Local not specified - Other | Admitting: Psychiatry

## 2013-07-05 ENCOUNTER — Encounter (HOSPITAL_COMMUNITY): Payer: Self-pay | Admitting: Psychiatry

## 2013-07-05 VITALS — BP 91/75 | HR 89 | Ht 63.0 in | Wt 184.6 lb

## 2013-07-05 DIAGNOSIS — F319 Bipolar disorder, unspecified: Secondary | ICD-10-CM

## 2013-07-05 MED ORDER — BENZTROPINE MESYLATE 0.5 MG PO TABS: 0.5000 mg | ORAL_TABLET | Freq: Two times a day (BID) | ORAL | Status: DC

## 2013-07-05 MED ORDER — RISPERIDONE 2 MG PO TABS: 2.0000 mg | ORAL_TABLET | Freq: Every day | ORAL | Status: DC

## 2013-07-05 MED ORDER — DIVALPROEX SODIUM 500 MG PO DR TAB: 500.0000 mg | DELAYED_RELEASE_TABLET | Freq: Two times a day (BID) | ORAL | Status: DC

## 2013-07-05 NOTE — Progress Notes (Signed)
Abigail Mahoney [id number] Progress Note  Abigail PanningSheila L Mahoney [id number] 58 y.o.  07/05/2013 10:35 AM  Chief Complaint: Medication management and followup.    History of Present Illness: Abigail HatchetSheila came for her followup appointment.  She is compliant with respiratory, Depakote and Cogentin.  She denies any recent hallucinations or any paranoia.  She is sleeping better.  She bring her list of medication.  There has been no changes.   She still has some time mood lability and get emotional but overall she is feeling better .  She denies any agitation or any anger.  She denies any paranoia.  She was to continue her current psychotropic medication.  She has difficulty walking and she requires cane for support.  Patient is very happy because she able to make some new friends from the church.  Patient likes living in [name] [place].  She is not drinking or using any illegal substances.  Her appetite is good.  She denies any changes in her appetite.  Part of Depakote level was 45.3 on 02/23/2013.  Suicidal Ideation: No Plan Formed: No Patient has means to carry out plan: No  Homicidal Ideation: No Plan Formed: No Patient has means to carry out plan: No  Review of Systems: Psychiatric: Agitation: No Hallucination: No Depressed Mood: No Insomnia: No Hypersomnia: No Altered Concentration: No Feels Worthless: No Grandiose Ideas: No Belief In Special Powers: No New/Increased Substance Abuse: No Compulsions: No  Neurologic: Headache: No Seizure: No Paresthesias: No  Past Medical Family, Social History: Patient has 1 son and one 57-year-old grandchild.  She is seeing Dr. Bradly ChrisPeter Nishnan for hypertension cardiomyopathy and mitral regurgitation.  Her primary care physician is Florentina JennyHenry Tripp.  She is living in a group home.  She has limited social network.    Outpatient Encounter Prescriptions as of 07/05/2013  Medication Sig  . benztropine (COGENTIN) 0.5 MG tablet Take 1 tablet (0.5 mg total) by mouth  2 (two) times daily.  . divalproex (DEPAKOTE) 500 MG DR tablet Take 1 tablet (500 mg total) by mouth 2 (two) times daily.  . [DISCONTINUED] benztropine (COGENTIN) 0.5 MG tablet Take 1 tablet (0.5 mg total) by mouth 2 (two) times daily.  . [DISCONTINUED] divalproex (DEPAKOTE) 500 MG DR tablet Take 1 tablet (500 mg total) by mouth 2 (two) times daily.  Marland Kitchen. desonide (DESOWEN) 0.05 % cream Apply to face prn  . digoxin (LANOXIN) 0.125 MG tablet Take 1 tablet (0.125 mg total) by mouth daily.  . furosemide (LASIX) 40 MG tablet Take 1 tablet (40 mg total) by mouth 2 (two) times daily.  Marland Kitchen. ibuprofen (ADVIL) 200 MG tablet Take 1 tablet (200 mg total) by mouth daily.  Marland Kitchen. lisinopril (PRINIVIL,ZESTRIL) 5 MG tablet TAKE ONE TABLET BY MOUTH DAILY  . metoprolol succinate (TOPROL-XL) 100 MG 24 hr tablet 100 mg daily. Take 50mg  with or immediately following a meal.  . potassium chloride SA (K-DUR,KLOR-CON) 20 MEQ tablet Take 20 mEq by mouth 2 (two) times daily. 1 am and 1/2 tab pm  . risperiDONE (RISPERDAL) 2 MG tablet Take 1 tablet (2 mg total) by mouth at bedtime.  . sotalol (BETAPACE) 80 MG tablet TAKE ONE TABLET BY MOUTH TWICE DAILY  . warfarin (COUMADIN) 5 MG tablet Take 1 tablet (5 mg total) by mouth as directed.  . [DISCONTINUED] risperiDONE (RISPERDAL) 2 MG tablet Take 1 tablet (2 mg total) by mouth at bedtime.    Past Psychiatric History/Hospitalization(s): She has at least one psychiatric hospitalization in 80`s.  She  do not remember the details.  However she denies any suicidal attempt.  She is on Depakote Risperdal and Cogentin since she started coming to this office.  Anxiety: Yes Bipolar Disorder: Yes Depression: Yes Mania: Yes Psychosis: Yes Schizophrenia: No Personality Disorder: No Hospitalization for psychiatric illness: Yes History of Electroconvulsive Shock Therapy: No Prior Suicide Attempts: No  Physical Exam: Constitutional:  BP 91/75  Pulse 89  Ht 5\' 3"  (1.6 m)  Wt 184 lb 9.6 oz  (83.734 kg)  BMI 32.71 kg/m2  General Appearance: well nourished  Musculoskeletal: Strength & Muscle Tone: decreased Gait & Station: unsteady Patient leans: Front, needs cain to help walking.  Psychiatric: Speech (describe rate, volume, coherence, spontaneity, and abnormalities if any): Incoherent and rambling at times  Thought Process (describe rate, content, abstract reasoning, and computation): Circumstantial.  Associations: Loose  Thoughts: Rumination   Mental Status: Orientation: oriented to person and place Mood & Affect: labile affect Attention Span & Concentration: Fair  Established Problem, Stable/Improving (1), Review of Psycho-Social Stressors (1), Review of Last Therapy Session (1) and Review of Medication Regimen & Side Effects (2)  Assessment: Axis I: Bipolar disorder  Axis II: Deferred  Axis III:  Past Medical History  Diagnosis Date  . Cardiomyopathy     CHF-EF 25 to 30%/global hypokinesis with no known CAD  . Hypertension   . CHF (congestive heart failure)   . Bipolar disorder   . Obesity   . Atrial fibrillation July 2009    a. very difficult to achieve rate control secondary  to tendency to severe hypotension on medication. b. failed direct current cardioversion September 15, 2007. c. started amiodarone September 16, 2007 with spontaneous conversion of sinus rhythm after 72 hour load on September 18, 2007.  . Tobacco abuse   . Chronic anticoagulation   . Mitral regurgitation     moderate to severe; not an ideal candidate for MV surgery    Axis IV: Mild to moderate  Axis V: 50-55   Plan:  Patient is doing better on her current psychotropic medication.  She is compliant with medication and denies any side effects.  I will continue Depakote 500 mg twice a day, Cogentin 0.5 mg twice a day and Risperdal 2 mg at bedtime.  Recommended to call us back if she has any question or any concern.  I will see her again in 3 months.    Efrain Clauson T.,  MD 07/05/2013

## 2013-07-13 ENCOUNTER — Telehealth: Payer: Self-pay | Admitting: Cardiovascular Disease

## 2013-07-13 NOTE — Telephone Encounter (Signed)
New Prob    Pt sister has some questions regarding pts upcoming appt. Please call.

## 2013-07-13 NOTE — Telephone Encounter (Signed)
No answer, ringing phone, unable to leave message.

## 2013-07-14 NOTE — Telephone Encounter (Signed)
Spoke with patient's caregiver, Laroy Apple, who states she received a call yesterday stating that patient had an appointment Monday May 11 with Dr. Eden Emms.  Caregiver states patient is unable to come Monday.  I reviewed patient's appointments and advised that patient is scheduled to see Dr. Eden Emms June 11.  Caregiver states she is able to keep this appointment; states the person that called earlier insisted that the appointment was May 11.  Caregiver thanked me for the call.

## 2013-07-25 ENCOUNTER — Encounter (HOSPITAL_COMMUNITY): Payer: Self-pay | Admitting: Pharmacist

## 2013-07-25 ENCOUNTER — Telehealth: Payer: Self-pay | Admitting: Cardiovascular Disease

## 2013-07-25 NOTE — Telephone Encounter (Signed)
Yes can stop blood thinner for dental work

## 2013-07-25 NOTE — Telephone Encounter (Signed)
New message    Having teeth extraction on  5/26 . Pt need to come off coumadin prior to dental work.    Dr. Ocie Doyne on [address].

## 2013-07-27 NOTE — Telephone Encounter (Signed)
PT'S  SISTER NOTIFIED .Abigail Mahoney

## 2013-07-27 NOTE — H&P (Signed)
HISTORY AND PHYSICAL  Abigail Mahoney is a 58 y.o. female patient with CC: Referred by general dentist for multiple extractions.  No diagnosis found.  Past Medical History  Diagnosis Date  . Cardiomyopathy     CHF-EF 25 to 30%/global hypokinesis with no known CAD  . Hypertension   . CHF (congestive heart failure)   . Bipolar disorder   . Obesity   . Atrial fibrillation July 2009    a. very difficult to achieve rate control secondary  to tendency to severe hypotension on medication. b. failed direct current cardioversion September 15, 2007. c. started amiodarone September 16, 2007 with spontaneous conversion of sinus rhythm after 72 hour load on September 18, 2007.  . Tobacco abuse   . Chronic anticoagulation   . Mitral regurgitation     moderate to severe; not an ideal candidate for MV surgery    No current facility-administered medications for this encounter.   Current Outpatient Prescriptions  Medication Sig Dispense Refill  . acetaminophen (TYLENOL) 325 MG tablet Take 650 mg by mouth every 6 (six) hours as needed (pain).      . benztropine (COGENTIN) 0.5 MG tablet Take 1 tablet (0.5 mg total) by mouth 2 (two) times daily.  180 tablet  0  . cholecalciferol (VITAMIN D) 400 UNITS TABS tablet Take 800 Units by mouth every morning.      . digoxin (LANOXIN) 0.125 MG tablet Take 1 tablet (0.125 mg total) by mouth daily.  30 tablet  5  . divalproex (DEPAKOTE) 500 MG DR tablet Take 1 tablet (500 mg total) by mouth 2 (two) times daily.  180 tablet  0  . furosemide (LASIX) 40 MG tablet Take 1 tablet (40 mg total) by mouth 2 (two) times daily.  60 tablet  3  . lisinopril (PRINIVIL,ZESTRIL) 5 MG tablet Take 5 mg by mouth every morning.      . metoprolol succinate (TOPROL-XL) 100 MG 24 hr tablet Take 100 mg by mouth every morning. Take 50mg  with or immediately following a meal.      . potassium chloride (K-DUR) 10 MEQ tablet Take 10 mEq by mouth every evening.      . potassium chloride SA (K-DUR,KLOR-CON) 20  MEQ tablet Take 20 mEq by mouth every morning.      . risperiDONE (RISPERDAL) 2 MG tablet Take 1 tablet (2 mg total) by mouth at bedtime.  90 tablet  0  . sotalol (BETAPACE) 80 MG tablet Take 80 mg by mouth 2 (two) times daily.      Marland Kitchen warfarin (COUMADIN) 1 MG tablet Take 0.5 mg by mouth 4 (four) times a week. Tues, Thurs, Sat, Sun - 3.5 mg total      . warfarin (COUMADIN) 3 MG tablet Take 3 mg by mouth 4 (four) times a week. Tues, Thurs, Sat, Sun - 3.5 mg total      . warfarin (COUMADIN) 4 MG tablet Take 4 mg by mouth every Monday, Wednesday, and Friday.       No Known Allergies Active Problems:   * No active hospital problems. *  Vitals: There were no vitals taken for this visit. Lab results:No results found for this or any previous visit (from the past 24 hour(s)). Radiology Results: No results found. General appearance: cooperative, moderately obese and slowed mentation Head: Normocephalic, without obvious abnormality, atraumatic Eyes: negative Nose: Nares normal. Septum midline. Mucosa normal. No drainage or sinus tenderness. Throat: Dental caries and periodontitis teeth #'s 3, 4, 5, 6, 7,  8, 9, 10, 11, 12, 14, 20, 21, 28, 29 Neck: no carotid bruit, supple, symmetrical, trachea midline and thyroid not enlarged, symmetric, no tenderness/mass/nodules Resp: clear to auscultation bilaterally Cardio: regular rate and rhythm, S1, S2 normal, no murmur, click, rub or gallop  Assessment: Nonrestorable teeth #'s 3, 4, 5, 6, 7, 8, 9, 10, 11, 12, 14, 20, 21, 28, 29   Plan: extraction  teeth #'s 3, 4, 5, 6, 7, 8, 9, 10, 11, 12, 14, 20, 21, 28, 29. Alveoloplasty. Day surgery. General anesthesia.   Osceola Depaz Thad RangerM Barney Russomanno 07/27/2013

## 2013-07-28 ENCOUNTER — Encounter (HOSPITAL_COMMUNITY): Payer: Self-pay

## 2013-07-28 ENCOUNTER — Encounter (HOSPITAL_COMMUNITY)
Admission: RE | Admit: 2013-07-28 | Discharge: 2013-07-28 | Disposition: A | Discharge status: Home / Self Care | Payer: Medicaid Other | Source: Ambulatory Visit | Attending: Oral Surgery | Admitting: Oral Surgery

## 2013-07-28 DIAGNOSIS — Z01810 Encounter for preprocedural cardiovascular examination: Secondary | ICD-10-CM | POA: Insufficient documentation

## 2013-07-28 DIAGNOSIS — Z01818 Encounter for other preprocedural examination: Secondary | ICD-10-CM | POA: Insufficient documentation

## 2013-07-28 DIAGNOSIS — Z01812 Encounter for preprocedural laboratory examination: Secondary | ICD-10-CM | POA: Insufficient documentation

## 2013-07-28 HISTORY — DX: Cardiac arrhythmia, unspecified: I49.9

## 2013-07-28 LAB — BASIC METABOLIC PANEL
BUN: 20 mg/dL (ref 6–23)
CHLORIDE: 104 meq/L (ref 96–112)
CO2: 25 mEq/L (ref 19–32)
Calcium: 9.5 mg/dL (ref 8.4–10.5)
Creatinine, Ser: 1.02 mg/dL (ref 0.50–1.10)
GFR calc Af Amer: 70 mL/min — ABNORMAL LOW (ref >90)
GFR, EST NON AFRICAN AMERICAN: 60 mL/min — AB (ref >90)
GLUCOSE: 90 mg/dL (ref 70–99)
POTASSIUM: 4.7 meq/L (ref 3.7–5.3)
Sodium: 142 mEq/L (ref 137–147)

## 2013-07-28 LAB — CBC
HEMATOCRIT: 37.3 % (ref 36.0–46.0)
HEMOGLOBIN: 13.5 g/dL (ref 12.0–15.0)
MCH: 31.2 pg (ref 26.0–34.0)
MCHC: 36.2 g/dL — ABNORMAL HIGH (ref 30.0–36.0)
MCV: 86.1 fL (ref 78.0–100.0)
Platelets: 170 10*3/uL (ref 150–400)
RBC: 4.33 MIL/uL (ref 3.87–5.11)
RDW: 18.4 % — ABNORMAL HIGH (ref 11.5–15.5)
WBC: 6 10*3/uL (ref 4.0–10.5)

## 2013-07-28 LAB — PROTIME-INR
INR: 1.32 (ref 0.00–1.49)
PROTHROMBIN TIME: 16.1 s — AB (ref 11.6–15.2)

## 2013-07-28 LAB — APTT: aPTT: 38 seconds — ABNORMAL HIGH (ref 24–37)

## 2013-07-28 NOTE — Pre-Procedure Instructions (Signed)
MAKIKO TSUTSUI  07/28/2013   Your procedure is scheduled on:  08/02/13  Report to [facility] Admitting at 650 AM.  Call this number if you have problems the morning of surgery: [phone]   Remember:   Do not eat food or drink liquids after midnight.   Take these medicines the morning of surgery with A SIP OF WATER: cogentin,lanoxin,depakote,toprol,betapace   Do not wear jewelry, make-up or nail polish.  Do not wear lotions, powders, or perfumes. You may wear deodorant.  Do not shave 48 hours prior to surgery. Men may shave face and neck.  Do not bring valuables to the hospital.  [facility] is not responsible                  for any belongings or valuables.               Contacts, dentures or bridgework may not be worn into surgery.  Leave suitcase in the car. After surgery it may be brought to your room.  For patients admitted to the hospital, discharge time is determined by your                treatment team.               Patients discharged the day of surgery will not be allowed to drive  home.  Name and phone number of your driver: family  Special Instructions: Incentive Spirometry - Practice and bring it with you on the day of surgery.   Please read over the following fact sheets that you were given: Pain Booklet, Coughing and Deep Breathing and Surgical Site Infection Prevention

## 2013-07-29 NOTE — Progress Notes (Signed)
Anesthesia  Chart Review:  Patient is a 58 year old female scheduled for multiple teeth extractions on 08/02/13 by Dr. Barbette Merino.  History includes smoking, HTN, afib with failed cardioversion '09, moderate to severe MR '11, tachycardia induced cardiomyopathy, CHF, Bipolar disorder.  BMI is consistent with obesity. Cardiologist is Dr. Eden Emms, last visit 12/23/12 with no new cardiac testing recommended.  He gave permission to hold blood thinner for dental work under anesthesia (see telephone encounter from 05/2013 and 07/2013).  She stopped her Coumadin on 07/28/13.  EKG on 07/28/13 showed afib at 112 bpm, T wave abnormality, consider inferolateral ischemia. HR was 85 bpm at PAT,  BP was low 82/60.  Meds include lanoxin, Toprol and betapace. Overall, her EKG (T wave abnormality) is probably stable when compared to her 08/07/10 and 02/24/12 EKGs (each of those had either baseline wanderer or artifact in certain leads; also 07/28/10 may have been underlying SR.  Dr. Eden Emms felt her 02/24/12 EKG showed afib)  I was not asked to evaluated her at her PAT visit.  Echo on 09/21/09 showed:  - Left ventricle: The cavity size was severely dilated. Wall thickness was normal. Systolic function was severely reduced. The estimated ejection fraction was in the range of 25% to 30%. Diffuse hypokinesis. - Mitral valve: Moderate to severe regurgitation. - Left atrium: The atrium was moderately to severely dilated. - Right ventricle: The cavity size was mildly dilated. - Right atrium: The atrium was moderately dilated. - Atrial septum: No defect or patent foramen ovale was identified. - Pulmonary arteries: PA peak pressure: 71mm Hg (S).  Preoperative CXR and labs noted.  Will order a repeat PT since INR is > 16; however if Dr. Barbette Merino is okay with labs (his office is closed today) then it may not have to be repeated since her Coumadin is already on hold.  Patient had a HR of 85 on arrival to PAT.  EKG showed HR of 112 bpm.  She is  scheduled to take digoxin, betapace, and Toprol on the morning of surgery so hopefully her afib will be rate controlled on arrival.  She will have her BP checked as well on arrival.  If BP and HR controlled and otherwise no acute changes then I would anticipate that she could proceed.  If uncontrolled afib, may have to contact cardiology for input.  Velna Ochs [facility] Short Stay Center/Anesthesiology Phone [phone] 07/29/2013 11:58 AM

## 2013-08-01 MED ORDER — CEFAZOLIN SODIUM-DEXTROSE 2-3 GM-% IV SOLR
2.0000 g | INTRAVENOUS | Status: AC
  Administered 2013-08-02: 2 g via INTRAVENOUS
  Filled 2013-08-01: qty 50

## 2013-08-02 ENCOUNTER — Encounter (HOSPITAL_COMMUNITY): Payer: Self-pay | Admitting: Anesthesiology

## 2013-08-02 ENCOUNTER — Ambulatory Visit (HOSPITAL_COMMUNITY): Payer: Medicaid Other | Admitting: Anesthesiology

## 2013-08-02 ENCOUNTER — Ambulatory Visit (HOSPITAL_COMMUNITY)
Admission: RE | Admit: 2013-08-02 | Discharge: 2013-08-02 | Disposition: A | Discharge status: Home / Self Care | Payer: Medicaid Other | Source: Ambulatory Visit | Attending: Oral Surgery | Admitting: Oral Surgery

## 2013-08-02 ENCOUNTER — Encounter (HOSPITAL_COMMUNITY)
Admission: RE | Disposition: A | Discharge status: Home / Self Care | Payer: Self-pay | Source: Ambulatory Visit | Attending: Oral Surgery

## 2013-08-02 ENCOUNTER — Ambulatory Visit: Payer: Self-pay | Admitting: Cardiovascular Disease

## 2013-08-02 ENCOUNTER — Encounter (HOSPITAL_COMMUNITY): Payer: Medicaid Other | Admitting: Vascular Surgery

## 2013-08-02 DIAGNOSIS — I509 Heart failure, unspecified: Secondary | ICD-10-CM | POA: Insufficient documentation

## 2013-08-02 DIAGNOSIS — F411 Generalized anxiety disorder: Secondary | ICD-10-CM | POA: Insufficient documentation

## 2013-08-02 DIAGNOSIS — I428 Other cardiomyopathies: Secondary | ICD-10-CM

## 2013-08-02 DIAGNOSIS — Z7901 Long term (current) use of anticoagulants: Secondary | ICD-10-CM | POA: Insufficient documentation

## 2013-08-02 DIAGNOSIS — F17200 Nicotine dependence, unspecified, uncomplicated: Secondary | ICD-10-CM | POA: Insufficient documentation

## 2013-08-02 DIAGNOSIS — I340 Nonrheumatic mitral (valve) insufficiency: Secondary | ICD-10-CM

## 2013-08-02 DIAGNOSIS — F319 Bipolar disorder, unspecified: Secondary | ICD-10-CM | POA: Insufficient documentation

## 2013-08-02 DIAGNOSIS — R259 Unspecified abnormal involuntary movements: Secondary | ICD-10-CM

## 2013-08-02 DIAGNOSIS — I059 Rheumatic mitral valve disease, unspecified: Secondary | ICD-10-CM | POA: Insufficient documentation

## 2013-08-02 DIAGNOSIS — I10 Essential (primary) hypertension: Secondary | ICD-10-CM | POA: Insufficient documentation

## 2013-08-02 DIAGNOSIS — K029 Dental caries, unspecified: Secondary | ICD-10-CM | POA: Diagnosis not present

## 2013-08-02 DIAGNOSIS — I4891 Unspecified atrial fibrillation: Secondary | ICD-10-CM | POA: Insufficient documentation

## 2013-08-02 DIAGNOSIS — R0602 Shortness of breath: Secondary | ICD-10-CM

## 2013-08-02 DIAGNOSIS — Z79899 Other long term (current) drug therapy: Secondary | ICD-10-CM | POA: Insufficient documentation

## 2013-08-02 DIAGNOSIS — K0530 Chronic periodontitis, unspecified: Secondary | ICD-10-CM | POA: Insufficient documentation

## 2013-08-02 DIAGNOSIS — E669 Obesity, unspecified: Secondary | ICD-10-CM

## 2013-08-02 DIAGNOSIS — K05329 Chronic periodontitis, generalized, unspecified severity: Secondary | ICD-10-CM

## 2013-08-02 HISTORY — PX: MULTIPLE EXTRACTIONS WITH ALVEOLOPLASTY: SHX5342

## 2013-08-02 LAB — PROTIME-INR
INR: 1.07 (ref 0.00–1.49)
PROTHROMBIN TIME: 13.7 s (ref 11.6–15.2)

## 2013-08-02 SURGERY — MULTIPLE EXTRACTION WITH ALVEOLOPLASTY
Anesthesia: General | Site: Mouth

## 2013-08-02 MED ORDER — FENTANYL CITRATE 0.05 MG/ML IJ SOLN
INTRAMUSCULAR | Status: DC | PRN
  Administered 2013-08-02 (×4): 50 ug via INTRAVENOUS

## 2013-08-02 MED ORDER — ONDANSETRON HCL 4 MG/2ML IJ SOLN
INTRAMUSCULAR | Status: DC | PRN
  Administered 2013-08-02: 4 mg via INTRAVENOUS

## 2013-08-02 MED ORDER — PROPOFOL 10 MG/ML IV BOLUS
INTRAVENOUS | Status: AC
  Filled 2013-08-02: qty 20

## 2013-08-02 MED ORDER — HYDROMORPHONE HCL PF 1 MG/ML IJ SOLN: 0.2500 mg | INTRAMUSCULAR | Status: DC | PRN

## 2013-08-02 MED ORDER — 0.9 % SODIUM CHLORIDE (POUR BTL) OPTIME
TOPICAL | Status: DC | PRN
  Administered 2013-08-02: 1000 mL

## 2013-08-02 MED ORDER — LIDOCAINE-EPINEPHRINE 2 %-1:100000 IJ SOLN
INTRAMUSCULAR | Status: DC | PRN
  Administered 2013-08-02: 20 mL via INTRADERMAL

## 2013-08-02 MED ORDER — LACTATED RINGERS IV SOLN
INTRAVENOUS | Status: DC | PRN
  Administered 2013-08-02: 09:00:00 via INTRAVENOUS

## 2013-08-02 MED ORDER — GLYCOPYRROLATE 0.2 MG/ML IJ SOLN
INTRAMUSCULAR | Status: DC | PRN
  Administered 2013-08-02: 0.2 mg via INTRAVENOUS

## 2013-08-02 MED ORDER — SODIUM CHLORIDE 0.9 % IR SOLN
Status: DC | PRN
  Administered 2013-08-02: 1000 mL

## 2013-08-02 MED ORDER — LIDOCAINE-EPINEPHRINE 2 %-1:100000 IJ SOLN
INTRAMUSCULAR | Status: AC
  Filled 2013-08-02: qty 1

## 2013-08-02 MED ORDER — ONDANSETRON HCL 4 MG/2ML IJ SOLN: 4.0000 mg | Freq: Once | INTRAMUSCULAR | Status: DC | PRN

## 2013-08-02 MED ORDER — DEXAMETHASONE SODIUM PHOSPHATE 10 MG/ML IJ SOLN
INTRAMUSCULAR | Status: DC | PRN
  Administered 2013-08-02: 10 mg via INTRAVENOUS

## 2013-08-02 MED ORDER — FENTANYL CITRATE 0.05 MG/ML IJ SOLN
INTRAMUSCULAR | Status: AC
  Filled 2013-08-02: qty 5

## 2013-08-02 MED ORDER — OXYCODONE-ACETAMINOPHEN 5-325 MG PO TABS: 1.0000 | ORAL_TABLET | ORAL | Status: DC | PRN

## 2013-08-02 MED ORDER — MIDAZOLAM HCL 2 MG/2ML IJ SOLN
INTRAMUSCULAR | Status: AC
  Filled 2013-08-02: qty 2

## 2013-08-02 MED ORDER — PHENYLEPHRINE HCL 10 MG/ML IJ SOLN
INTRAMUSCULAR | Status: DC | PRN
  Administered 2013-08-02 (×2): 40 ug via INTRAVENOUS

## 2013-08-02 MED ORDER — PROPOFOL 10 MG/ML IV BOLUS
INTRAVENOUS | Status: DC | PRN
  Administered 2013-08-02: 130 mg via INTRAVENOUS

## 2013-08-02 MED ORDER — SUCCINYLCHOLINE CHLORIDE 20 MG/ML IJ SOLN
INTRAMUSCULAR | Status: DC | PRN
  Administered 2013-08-02: 100 mg via INTRAVENOUS

## 2013-08-02 MED ORDER — OXYMETAZOLINE HCL 0.05 % NA SOLN
NASAL | Status: AC
  Filled 2013-08-02: qty 15

## 2013-08-02 MED ORDER — LACTATED RINGERS IV SOLN
INTRAVENOUS | Status: DC
  Administered 2013-08-02: 07:00:00 via INTRAVENOUS

## 2013-08-02 MED ORDER — LIDOCAINE HCL (CARDIAC) 20 MG/ML IV SOLN
INTRAVENOUS | Status: DC | PRN
  Administered 2013-08-02: 100 mg via INTRAVENOUS

## 2013-08-02 MED ORDER — OXYMETAZOLINE HCL 0.05 % NA SOLN
NASAL | Status: DC | PRN
  Administered 2013-08-02: 2 via NASAL

## 2013-08-02 SURGICAL SUPPLY — 30 items
BLADE 10 SAFETY STRL DISP (BLADE) ×3 IMPLANT
BUR CROSS CUT FISSURE 1.6 (BURR) ×2 IMPLANT
BUR CROSS CUT FISSURE 1.6MM (BURR) ×1
BUR EGG ELITE 4.0 (BURR) ×2 IMPLANT
BUR EGG ELITE 4.0MM (BURR) ×1
CANISTER SUCTION 2500CC (MISCELLANEOUS) ×3 IMPLANT
COVER SURGICAL LIGHT HANDLE (MISCELLANEOUS) ×3 IMPLANT
CRADLE DONUT ADULT HEAD (MISCELLANEOUS) ×3 IMPLANT
GAUZE PACKING FOLDED 2  STR (GAUZE/BANDAGES/DRESSINGS) ×2
GAUZE PACKING FOLDED 2 STR (GAUZE/BANDAGES/DRESSINGS) ×1 IMPLANT
GLOVE BIO SURGEON STRL SZ 6.5 (GLOVE) ×4 IMPLANT
GLOVE BIO SURGEON STRL SZ7.5 (GLOVE) ×3 IMPLANT
GLOVE BIO SURGEONS STRL SZ 6.5 (GLOVE) ×2
GLOVE BIOGEL PI IND STRL 7.0 (GLOVE) ×2 IMPLANT
GLOVE BIOGEL PI INDICATOR 7.0 (GLOVE) ×4
GOWN STRL REUS W/ TWL LRG LVL3 (GOWN DISPOSABLE) ×2 IMPLANT
GOWN STRL REUS W/ TWL XL LVL3 (GOWN DISPOSABLE) ×1 IMPLANT
GOWN STRL REUS W/TWL LRG LVL3 (GOWN DISPOSABLE) ×6
GOWN STRL REUS W/TWL XL LVL3 (GOWN DISPOSABLE) ×3
KIT BASIN OR (CUSTOM PROCEDURE TRAY) ×3 IMPLANT
KIT ROOM TURNOVER OR (KITS) ×3 IMPLANT
NEEDLE 22X1 1/2 (OR ONLY) (NEEDLE) ×3 IMPLANT
NS IRRIG 1000ML POUR BTL (IV SOLUTION) ×3 IMPLANT
PAD ARMBOARD 7.5X6 YLW CONV (MISCELLANEOUS) ×6 IMPLANT
SUT CHROMIC 3 0 PS 2 (SUTURE) ×6 IMPLANT
SYR CONTROL 10ML LL (SYRINGE) ×3 IMPLANT
TOWEL OR 17X26 10 PK STRL BLUE (TOWEL DISPOSABLE) ×3 IMPLANT
TRAY ENT MC OR (CUSTOM PROCEDURE TRAY) ×3 IMPLANT
TUBING IRRIGATION (MISCELLANEOUS) ×3 IMPLANT
YANKAUER SUCT BULB TIP NO VENT (SUCTIONS) ×3 IMPLANT

## 2013-08-02 NOTE — Anesthesia Procedure Notes (Signed)
Procedure Name: Intubation Date/Time: 08/02/2013 8:55 AM Performed by: Storm Frisk E Pre-anesthesia Checklist: Patient identified, Timeout performed, Emergency Drugs available, Suction available and Patient being monitored Patient Re-evaluated:Patient Re-evaluated prior to inductionOxygen Delivery Method: Circle system utilized Preoxygenation: Pre-oxygenation with 100% oxygen Intubation Type: IV induction Ventilation: Mask ventilation without difficulty Laryngoscope Size: Mac and 3 Grade View: Grade I Nasal Tubes: Left, Magill forceps- large, utilized, Nasal prep performed and Nasal Rae Tube size: 6.5 mm Number of attempts: 2 (Attempted right nare and met excessive resistance; switched to left nare and nasal rae passed successfully w/o complications) Intubation method: Red rubber urethral catheter to guide nasal rae through nasopharynx; red rubber pulled out of mouth. Placement Confirmation: ETT inserted through vocal cords under direct vision,  breath sounds checked- equal and bilateral and positive ETCO2 Secured at: 24 cm Tube secured with: Tape Dental Injury: Teeth and Oropharynx as per pre-operative assessment

## 2013-08-02 NOTE — Op Note (Signed)
08/02/2013  9:25 AM  PATIENT:  [name]  58 y.o. female  PRE-OPERATIVE DIAGNOSIS:  NON RESTORABLE TEETH #'s 3, 4, 5, 6, 7, 8, 9, 10, 11, 12, 14, 20, 21, 28, 29  POST-OPERATIVE DIAGNOSIS:  SAME  PROCEDURE:  Procedure(s): MULTIPLE EXTRACTIONS TEETH #'s 3, 4, 5, 6, 7, 8, 9, 10, 11, 12, 14, 20, 21, 28, 29 WITH ALVEOLOPLASTY  SURGEON:  Surgeon(s): Georgia Lopes, DDS  ANESTHESIA:   local and general  EBL:  minimal  DRAINS: none   SPECIMEN:  No Specimen  COUNTS:  YES  PLAN OF CARE: Discharge to home after PACU  PATIENT DISPOSITION:  PACU - hemodynamically stable.   PROCEDURE DETAILS: Dictation #[id number]  Georgia Lopes, DMD 08/02/2013 9:25 AM

## 2013-08-02 NOTE — H&P (Signed)
H&P documentation  -History and Physical Reviewed  -Patient has been re-examined  -No change in the plan of care  Eldwin Volkov M Tamika Shropshire  

## 2013-08-02 NOTE — Anesthesia Preprocedure Evaluation (Addendum)
Anesthesia Evaluation  Patient identified by MRN, date of birth, ID band Patient awake    Reviewed: Allergy & Precautions, H&P , NPO status , Patient's Chart, lab work & pertinent test results  Airway Mallampati: II TM Distance: >3 FB Neck ROM: Full    Dental  (+) Poor Dentition, Dental Advisory Given, Teeth Intact   Pulmonary Current Smoker,          Cardiovascular hypertension, Pt. on medications and Pt. on home beta blockers +CHF + dysrhythmias + Valvular Problems/Murmurs MR     Neuro/Psych PSYCHIATRIC DISORDERS Anxiety Bipolar Disorder    GI/Hepatic   Endo/Other    Renal/GU      Musculoskeletal   Abdominal   Peds  Hematology   Anesthesia Other Findings Pt. Having multiple teeth removed.  Reproductive/Obstetrics                         Anesthesia Physical Anesthesia Plan  ASA: IV  Anesthesia Plan: General   Post-op Pain Management:    Induction: Intravenous  Airway Management Planned: Nasal ETT  Additional Equipment:   Intra-op Plan:   Post-operative Plan: Extubation in OR  Informed Consent: I have reviewed the patients History and Physical, chart, labs and discussed the procedure including the risks, benefits and alternatives for the proposed anesthesia with the patient or authorized representative who has indicated his/her understanding and acceptance.     Plan Discussed with:   Anesthesia Plan Comments:         Anesthesia Quick Evaluation

## 2013-08-02 NOTE — Anesthesia Postprocedure Evaluation (Signed)
  Anesthesia Post-op Note  Patient: Abigail Mahoney  Procedure(s) Performed: Procedure(s): MULTIPLE EXTRACTIONS WITH ALVEOLOPLASTY (N/A)  Patient Location: PACU  Anesthesia Type:General  Level of Consciousness: awake, oriented, sedated and patient cooperative  Airway and Oxygen Therapy: Patient Spontanous Breathing  Post-op Pain: none  Post-op Assessment: Post-op Vital signs reviewed, Patient's Cardiovascular Status Stable, Respiratory Function Stable, Patent Airway, No signs of Nausea or vomiting and Pain level controlled  Post-op Vital Signs: stable  Last Vitals:  Filed Vitals:   08/02/13 1000  BP:   Pulse: 112  Temp:   Resp: 14    Complications: No apparent anesthesia complications

## 2013-08-02 NOTE — Op Note (Signed)
NAMECERETHA, LENTH              ACCOUNT NO.:  [id number]  MEDICAL RECORD NO.:  [id number]  LOCATION:  [facility]                         FACILITY:  [facility]  PHYSICIAN:  Georgia Lopes, M.D.  DATE OF BIRTH:  Sep 27, 1955  DATE OF PROCEDURE:  08/02/2013 DATE OF DISCHARGE:  08/02/2013                              OPERATIVE REPORT   PREOPERATIVE DIAGNOSIS:  Nonrestorable teeth #4, #5, #6, #7, #8, #9, #10, #11, #12, #14, #20, #21, #28, and #29.  POSTOPERATIVE DIAGNOSIS:  Nonrestorable teeth #4, #5, #6, #7, #8, #9, #10, #11, #12, #14, #20, #21, #28, and #29.  PROCEDURE:  Extraction of teeth #3, #4, #5, #6, #7, #8, #9, #10, #11, #12, #14, #20, #21, #28, #29.  Alveoplasty right and left maxilla, right and left mandible.  SURGEON:  Georgia Lopes, M.D.  ANESTHESIA:  General, Dr. Katrinka Blazing attending nasal intubation.  PROCEDURE IN DETAIL:  The patient was taken to the operating room, placed on the table in the supine position.  General anesthesia was administered intravenously and a nasal endotracheal tube was placed and secured.  The eyes were protected.  The patient was draped for the procedure.  Time-out was performed.  The posterior pharynx was suctioned and throat pack was placed.  A 2% lidocaine 1:100,000 epinephrine was infiltrated in an inferior alveolar block on the right and left side and a buccal and palatal infiltration in the maxilla.  Total of 14 mL was utilized.  A bite block was placed in the right side of the mouth and a sweetheart retractor was used to retract the tongue.  The left side was operated first.  A #15 blade was used to make an incision around teeth #20 and #21 in the mandible and around teeth #14, #12, #11, #10, #9, #8, and #7 in the maxilla.  The periosteum was reflected from around these teeth and then the teeth were elevated with a 301 elevator and removed from the mouth with a dental forceps.  The sockets were curetted, and then the tissue was retracted to  expose the alveolar crest in the left maxilla and left mandible.  Then, the alveoplasty was performed.  The egg-shaped burr and bone file.  Then, the areas were irrigated and closed with 3-0 chromic.  The bite block was repositioned and a sweetheart retractor was repositioned.  The right side was operated next.  A #15 blade was used to make an incision beginning at tooth #3 caring forward tooth #6 and then in the mandible around teeth numbers #28 and #29.  The periosteum was reflected from around these teeth with a periosteal elevator.  The teeth were elevated with a 301 elevator and removed the mouth with a dental forceps.  The sockets were then curetted.  The periosteum was reflected to expose the alveolar crest and then the alveoplasty was performed with the egg-shaped burr and bone file.  The areas were then irrigated and closed with 3-0 chromic.  The oral cavity was then inspected and found to have good contour, hemostasis, and closure.  The oral cavity was irrigated and suctioned. Throat pack was removed.  The patient was awakened and taken to the recovery room and breathing spontaneously  in good condition.  ESTIMATED BLOOD LOSS:  Minimal.  COMPLICATIONS:  None.  SPECIMENS:  None.     Georgia LopesScott M. Jadie Allington, M.D.     SMJ/MEDQ  D:  08/02/2013  T:  08/02/2013  Job:  [id number]

## 2013-08-02 NOTE — Transfer of Care (Signed)
Immediate Anesthesia Transfer of Care Note  Patient: Abigail Mahoney  Procedure(s) Performed: Procedure(s): MULTIPLE EXTRACTIONS WITH ALVEOLOPLASTY (N/A)  Patient Location: PACU  Anesthesia Type:General  Level of Consciousness: lethargic and responds to stimulation  Airway & Oxygen Therapy: Patient Spontanous Breathing and Patient connected to nasal cannula oxygen  Post-op Assessment: Report given to PACU RN and Post -op Vital signs reviewed and stable  Post vital signs: Reviewed and stable  Complications: No apparent anesthesia complications

## 2013-08-04 ENCOUNTER — Encounter (HOSPITAL_COMMUNITY): Payer: Self-pay | Admitting: Oral Surgery

## 2013-08-12 ENCOUNTER — Ambulatory Visit: Payer: Self-pay | Admitting: Cardiovascular Disease

## 2013-08-18 ENCOUNTER — Telehealth: Payer: Self-pay | Admitting: Physician Assistant

## 2013-08-18 ENCOUNTER — Encounter: Payer: Self-pay | Admitting: Physician Assistant

## 2013-08-18 ENCOUNTER — Ambulatory Visit (INDEPENDENT_AMBULATORY_CARE_PROVIDER_SITE_OTHER): Payer: Medicaid Other | Admitting: Physician Assistant

## 2013-08-18 VITALS — BP 82/0 | HR 110 | Ht 63.5 in | Wt 183.0 lb

## 2013-08-18 DIAGNOSIS — I10 Essential (primary) hypertension: Secondary | ICD-10-CM

## 2013-08-18 DIAGNOSIS — I059 Rheumatic mitral valve disease, unspecified: Secondary | ICD-10-CM

## 2013-08-18 DIAGNOSIS — I4891 Unspecified atrial fibrillation: Secondary | ICD-10-CM

## 2013-08-18 DIAGNOSIS — R0602 Shortness of breath: Secondary | ICD-10-CM

## 2013-08-18 DIAGNOSIS — I340 Nonrheumatic mitral (valve) insufficiency: Secondary | ICD-10-CM

## 2013-08-18 DIAGNOSIS — F17200 Nicotine dependence, unspecified, uncomplicated: Secondary | ICD-10-CM

## 2013-08-18 DIAGNOSIS — I428 Other cardiomyopathies: Secondary | ICD-10-CM

## 2013-08-18 DIAGNOSIS — I5022 Chronic systolic (congestive) heart failure: Secondary | ICD-10-CM

## 2013-08-18 NOTE — Progress Notes (Signed)
Cardiology Office Note   Date:  08/18/2013   ID:  CARICIA GILFORD, DOB [birth year], MRN [id number]  PCP:  MAZZOCCHI, Rise Mu, MD  Cardiologist:  Dr. Charlton Haws      History of Present Illness: Abigail Mahoney is a 58 y.o. 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  Last seen by Dr. Charlton Haws in 12/2012.   She returns for follow up.  She lives at [facility].  She is here with her aid.  She denies chest pain, syncope, orthopnea, PND, edema.  She notes chronic dyspnea.  It may be somewhat worse over the past 6 mos.  She continues to smoke.   Studies:  - Echo (09/2009):  EF 25% to 30%. Diffuse hypokinesis.  Mitral valve: Moderate to severe regurgitation.  Left atrium: The atrium was moderately to severely dilated.  Right ventricle: The cavity size was mildly dilated.  Right atrium: The atrium was moderately dilated.   PA peak pressure: 52mm Hg.    Recent Labs: 07/28/2013: Creatinine 1.02; Hemoglobin 13.5; Potassium 4.7   Wt Readings from Last 3 Encounters:  08/18/13 183 lb (83.008 kg)  08/02/13 181 lb 11 oz (82.413 kg)  08/02/13 181 lb 11 oz (82.413 kg)     Past Medical History  Diagnosis Date  . Cardiomyopathy     CHF-EF 25 to 30%/global hypokinesis with no known CAD  . Hypertension   . CHF (congestive heart failure)   . Bipolar disorder   . Obesity   . Atrial fibrillation July 2009    a. very difficult to achieve rate control secondary  to tendency to severe hypotension on medication. b. failed direct current cardioversion September 15, 2007. c. started amiodarone September 16, 2007 with spontaneous conversion of sinus rhythm after 72 hour load on September 18, 2007.  . Tobacco abuse   . Chronic anticoagulation   . Mitral regurgitation     moderate to severe; not an ideal candidate for MV surgery  . Dysrhythmia     hx AF    Current  Outpatient Prescriptions  Medication Sig Dispense Refill  . acetaminophen (TYLENOL) 325 MG tablet Take 650 mg by mouth every 6 (six) hours as needed.      . benztropine (COGENTIN) 0.5 MG tablet Take 1 tablet (0.5 mg total) by mouth 2 (two) times daily.  180 tablet  0  . cholecalciferol (VITAMIN D) 400 UNITS TABS tablet Take 800 Units by mouth every morning.      . digoxin (LANOXIN) 0.125 MG tablet Take 1 tablet (0.125 mg total) by mouth daily.  30 tablet  5  . divalproex (DEPAKOTE) 500 MG DR tablet Take 1 tablet (500 mg total) by mouth 2 (two) times daily.  180 tablet  0  . furosemide (LASIX) 40 MG tablet Take 1 tablet (40 mg total) by mouth 2 (two) times daily.  60 tablet  3  . lisinopril (PRINIVIL,ZESTRIL) 5 MG tablet Take 5 mg by mouth every morning.      . metoprolol succinate (TOPROL-XL) 100 MG 24 hr tablet Take 100 mg by mouth every morning. Take 50mg  with or immediately following a meal.      . potassium chloride (K-DUR) 10 MEQ tablet Take 10 mEq by mouth every evening.      . potassium chloride SA (K-DUR,KLOR-CON) 20 MEQ tablet Take 20 mEq by mouth daily.      Marland Kitchen  risperiDONE (RISPERDAL) 2 MG tablet Take 1 tablet (2 mg total) by mouth at bedtime.  90 tablet  0  . sotalol (BETAPACE) 80 MG tablet Take 80 mg by mouth 2 (two) times daily.      Marland Kitchen. warfarin (COUMADIN) 4 MG tablet Take 4 mg by mouth daily.        No current facility-administered medications for this visit.    Allergies:   Review of patient's allergies indicates no known allergies.   Social History:  The patient  reports that she has been smoking.  She does not have any smokeless tobacco history on file. She reports that she does not drink alcohol or use illicit drugs.   Family History:  The patient's family history includes Diabetes in her other; Hypertension in her other.   ROS:  Please see the history of present illness.      All other systems reviewed and negative.   PHYSICAL EXAM: VS:  BP 82/0  Pulse 110  Ht 5' 3.5"  (1.613 m)  Wt 183 lb (83.008 kg)  BMI 31.90 kg/m2 Well nourished, well developed, in no acute distress HEENT: normal Neck: no JVD Cardiac:  Distant heart sounds; irreg irreg rhythm; no murmur Lungs:  Decreased breath sounds bilaterally, no wheezing, rhonchi or rales Abd: soft, nontender, no hepatomegaly Ext: no edema Skin: warm and dry Neuro:  CNs 2-12 intact, no focal abnormalities noted  EKG:  AFib, HR 110     ASSESSMENT AND PLAN:  1. Atrial fibrillation:  Rate is uncontrolled on a combination of Digoxin, Toprol XL, Sotalol.  She has failed DCCV in the past.  It appears from reviewing her chart that she is being managed conservatively.  She tells me that her coumadin is being managed at her facility. Recent CBC with normal Hgb.  I will review with Dr. Charlton HawsPeter Nishan further treatment strategies, if any.  For now, continue current Rx.  2. CARDIOMYOPATHY:  Thought to be related to tachycardia.  Continue beta blocker and ACEi.   3. Chronic systolic heart failure:  Volume stable.  Continue current Rx.  Recent BMET with stable K+ and creatinine. 4. MR (mitral regurgitation):  Not a surgical candidate.   5. HYPERTENSION:  BP is hard to hear. Review of chart indicates this has been a problem in the past.  She is not symptomatic with her current BP.  No change in Rx.  6. TOBACCO ABUSE:  She understands that she should quit. 7. Disposition:  Chart was reviewed further after the patient left the office.  Will request follow up labs at her facility:  Dig level, TSH, BNP.   F/u with Dr. Charlton HawsPeter Nishan in 3 mos.     Signed, Abigail RimScott Shaton Lore, PA-C, MHS 08/18/2013 10:53 AM    [facility] [address], [place], [place]  [address] Phone: [phone]) [phone]; Fax: [phone](336) [phone]

## 2013-08-18 NOTE — Telephone Encounter (Signed)
Please notify ZEPHORA DYNES that I have reviewed her chart further. I would like her to have the following labs drawn: Digoxin level (draw in the morning before Digoxin is to be taken) TSH BNP Dx:  427.31, 786.05, 423.22. Thanks,  Tereso Newcomer, PA-C   08/18/2013 9:32 PM

## 2013-08-18 NOTE — Patient Instructions (Signed)
Your physician recommends that you schedule a follow-up appointment in: 3 MONTHS WITH DR. Eden Emms  NO CHANGES WERE MADE TODAY

## 2013-08-19 NOTE — Telephone Encounter (Signed)
Notified patient's mother that patient needs lab work drawn and that she needs it drawn prior to taking the Digoxin for the day. Patient's mother stated they would come in next Tuesday early. Verbalized understanding to come early in day prior to taking the day's Digoxin dose.

## 2013-08-19 NOTE — Addendum Note (Signed)
Addended by: Shela Nevin D on: 08/19/2013 09:22 AM   Modules accepted: Orders

## 2013-08-22 ENCOUNTER — Telehealth: Payer: Self-pay | Admitting: Physician Assistant

## 2013-08-22 NOTE — Telephone Encounter (Signed)
I reviewed Abigail Mahoney's case with Dr. Charlton Haws. He suggested some changes: Stop Sotalol. Start Cardizem CD 360 mg QD. Take Toprol in AM and Cardizem in the afternoon (~ 6-8 hours apart). Keep follow up as planned. Tereso Newcomer, PA-C   08/22/2013 8:50 PM

## 2013-08-23 ENCOUNTER — Telehealth: Payer: Self-pay | Admitting: *Deleted

## 2013-08-23 ENCOUNTER — Other Ambulatory Visit (INDEPENDENT_AMBULATORY_CARE_PROVIDER_SITE_OTHER): Payer: Medicaid Other

## 2013-08-23 DIAGNOSIS — I4891 Unspecified atrial fibrillation: Secondary | ICD-10-CM

## 2013-08-23 DIAGNOSIS — R0602 Shortness of breath: Secondary | ICD-10-CM

## 2013-08-23 LAB — TSH: TSH: 0.9 u[IU]/mL (ref 0.35–4.50)

## 2013-08-23 MED ORDER — DILTIAZEM HCL ER COATED BEADS 360 MG PO CP24: 360.0000 mg | ORAL_CAPSULE | Freq: Every evening | ORAL | Status: DC

## 2013-08-23 NOTE — Telephone Encounter (Signed)
s/w Linda, RN at SNF about med changes per Scott Weaver, PA's conversation with Dr. Peter Nishan. Linda gave fax # [phone]. I will fax over phone note today for review w/NP at SNF today.  

## 2013-08-23 NOTE — Telephone Encounter (Signed)
s/w Bonita Quin, RN at SNF about med changes per Tereso Newcomer, PA's conversation with Dr. Charlton Haws. Bonita Quin gave fax # [phone]. I will fax over phone note today for review w/NP at SNF today.

## 2013-08-24 ENCOUNTER — Telehealth: Payer: Self-pay | Admitting: Physician Assistant

## 2013-08-24 LAB — DIGOXIN LEVEL: DIGOXIN LVL: 1.7 ng/mL (ref 0.8–2.0)

## 2013-08-24 MED ORDER — DILTIAZEM HCL ER COATED BEADS 360 MG PO CP24: 360.0000 mg | ORAL_CAPSULE | Freq: Every evening | ORAL | Status: AC

## 2013-08-24 NOTE — Telephone Encounter (Signed)
New message     Medication were changed yesterday---but pharmacist says it was not electronic signed.  Please fax signed orders [facility]  [phone]

## 2013-08-24 NOTE — Telephone Encounter (Signed)
Spoke with Morrison Old and told her I would send prescription electronically to [facility].

## 2013-08-25 ENCOUNTER — Other Ambulatory Visit (INDEPENDENT_AMBULATORY_CARE_PROVIDER_SITE_OTHER): Payer: Medicaid Other

## 2013-08-25 ENCOUNTER — Other Ambulatory Visit: Payer: Self-pay | Admitting: Physician Assistant

## 2013-08-25 ENCOUNTER — Telehealth: Payer: Self-pay | Admitting: *Deleted

## 2013-08-25 DIAGNOSIS — I4891 Unspecified atrial fibrillation: Secondary | ICD-10-CM

## 2013-08-25 DIAGNOSIS — I5022 Chronic systolic (congestive) heart failure: Secondary | ICD-10-CM

## 2013-08-25 LAB — BRAIN NATRIURETIC PEPTIDE: Pro B Natriuretic peptide (BNP): 155 pg/mL — ABNORMAL HIGH (ref 0.0–100.0)

## 2013-08-25 MED ORDER — DIGOXIN 62.5 MCG PO TABS: 0.0625 mg | ORAL_TABLET | Freq: Every day | ORAL | Status: AC

## 2013-08-25 NOTE — Telephone Encounter (Signed)
s/w Phillinda, RN @ SNF about pt's lab results and dosage change with digoxin. New order for digoxin faxed over with lab results to [phone]. RN and I made appt for 09/08/13 10:30 for repeat digoxin level with bnp as well.

## 2013-08-26 ENCOUNTER — Telehealth: Payer: Self-pay | Admitting: *Deleted

## 2013-08-26 LAB — DIGOXIN LEVEL: DIGOXIN LVL: 1 ng/mL (ref 0.8–2.0)

## 2013-08-26 NOTE — Telephone Encounter (Signed)
lmom labs ok, I faxed lab results to SNF today as well

## 2013-09-01 NOTE — Progress Notes (Signed)
Quick Note:  LMTCB 6/25 - PE ______

## 2013-09-06 ENCOUNTER — Telehealth: Payer: Self-pay | Admitting: Cardiovascular Disease

## 2013-09-06 NOTE — Telephone Encounter (Signed)
Returned call to patient's sister Joana Reamer she stated her sister lives in a assisted living facility and she was returning call left about her sister's lab work.Advised bnp close to normal.Advised to continue same medication.Copy of lab was faxed to assisted living.

## 2013-09-06 NOTE — Telephone Encounter (Signed)
New message    Sister calling stating someone keep calling regarding lab results  .   Sister stating she receive her sister lab results & aware of lab appt.

## 2013-09-08 ENCOUNTER — Other Ambulatory Visit: Payer: Medicaid Other

## 2013-09-27 ENCOUNTER — Ambulatory Visit (INDEPENDENT_AMBULATORY_CARE_PROVIDER_SITE_OTHER): Payer: Federal, State, Local not specified - Other | Admitting: Psychiatry

## 2013-09-27 ENCOUNTER — Encounter (HOSPITAL_COMMUNITY): Payer: Self-pay | Admitting: Psychiatry

## 2013-09-27 VITALS — BP 97/61 | HR 105 | Ht 63.0 in | Wt 181.0 lb

## 2013-09-27 DIAGNOSIS — Z79899 Other long term (current) drug therapy: Secondary | ICD-10-CM

## 2013-09-27 DIAGNOSIS — F319 Bipolar disorder, unspecified: Secondary | ICD-10-CM

## 2013-09-27 MED ORDER — BENZTROPINE MESYLATE 0.5 MG PO TABS: 0.5000 mg | ORAL_TABLET | Freq: Two times a day (BID) | ORAL | Status: DC

## 2013-09-27 MED ORDER — DIVALPROEX SODIUM 500 MG PO DR TAB: 500.0000 mg | DELAYED_RELEASE_TABLET | Freq: Two times a day (BID) | ORAL | Status: DC

## 2013-09-27 MED ORDER — RISPERIDONE 2 MG PO TABS
2.0000 mg | ORAL_TABLET | Freq: Every day | ORAL | Status: DC
Start: 2013-09-27 — End: 2013-12-27

## 2013-09-27 NOTE — Progress Notes (Signed)
[place] [phone] Progress Note  Abigail Mahoney [id number] 58 y.o.  09/27/2013 11:00 AM  Chief Complaint: I had a toothache.  It has to be removed.      History of Present Illness: Abigail Mahoney came for her followup appointment.  She is taking her medication as prescribed.  Recently she was seen by a dentist and her teeth were removed.  She also saw her cardiologist but she does not recall any medication changes.  She is taking Risperdal, Depakote and Cogentin.  Her mood remains very labile however she denies any hallucination or any paranoia.  She is sleeping better.  She likes the place where she is living.  She denies any issues with the staff.  She denies any agitation, anger or any mood swings.  She is trying to keep herself busy and going to the church.  She has difficulty walking .  She has no tremors however her gait is unsteady .  Her appetite is good.  Her vital signs stable.  She denies any crying spells, feeling of hopelessness or worthlessness.  She likes living in [facility].  She is able to make some plans.    Suicidal Ideation: No Plan Formed: No Patient has means to carry out plan: No  Homicidal Ideation: No Plan Formed: No Patient has means to carry out plan: No  Review of Systems  Skin: Negative for itching and rash.   Psychiatric: Agitation: No Hallucination: No Depressed Mood: No Insomnia: No Hypersomnia: No Altered Concentration: No Feels Worthless: No Grandiose Ideas: No Belief In Special Powers: No New/Increased Substance Abuse: No Compulsions: No  Neurologic: Headache: No Seizure: No Paresthesias: No  Medical History:  She is seeing Dr. Marlou Mahoney for hypertension cardiomyopathy and mitral regurgitation.  Her primary care physician is Abigail Mahoney.   Recent Results (from the past 2160 hour(s))  BASIC METABOLIC PANEL     Status: Abnormal   Collection Time    07/28/13  9:23 AM      Result Value Ref Range   Sodium 142  137 - 147 mEq/L    Potassium 4.7  3.7 - 5.3 mEq/L   Chloride 104  96 - 112 mEq/L   CO2 25  19 - 32 mEq/L   Glucose, Bld 90  70 - 99 mg/dL   BUN 20  6 - 23 mg/dL   Creatinine, Ser 1.02  0.50 - 1.10 mg/dL   Calcium 9.5  8.4 - 10.5 mg/dL   GFR calc non Af Amer 60 (*) >90 mL/min   GFR calc Af Amer 70 (*) >90 mL/min   Comment: (NOTE)     The eGFR has been calculated using the CKD EPI equation.     This calculation has not been validated in all clinical situations.     eGFR's persistently <90 mL/min signify possible Chronic Kidney     Disease.  CBC     Status: Abnormal   Collection Time    07/28/13  9:23 AM      Result Value Ref Range   WBC 6.0  4.0 - 10.5 K/uL   RBC 4.33  3.87 - 5.11 MIL/uL   Hemoglobin 13.5  12.0 - 15.0 g/dL   HCT 37.3  36.0 - 46.0 %   MCV 86.1  78.0 - 100.0 fL   MCH 31.2  26.0 - 34.0 pg   MCHC 36.2 (*) 30.0 - 36.0 g/dL   RDW 18.4 (*) 11.5 - 15.5 %   Platelets 170  150 -  400 K/uL  PROTIME-INR     Status: Abnormal   Collection Time    07/28/13  9:23 AM      Result Value Ref Range   Prothrombin Time 16.1 (*) 11.6 - 15.2 seconds   INR 1.32  0.00 - 1.49  APTT     Status: Abnormal   Collection Time    07/28/13  9:23 AM      Result Value Ref Range   aPTT 38 (*) 24 - 37 seconds   Comment:            IF BASELINE aPTT IS ELEVATED,     SUGGEST PATIENT RISK ASSESSMENT     BE USED TO DETERMINE APPROPRIATE     ANTICOAGULANT THERAPY.  PROTIME-INR     Status: None   Collection Time    08/02/13  6:57 AM      Result Value Ref Range   Prothrombin Time 13.7  11.6 - 15.2 seconds   INR 1.07  0.00 - 1.49  TSH     Status: None   Collection Time    08/23/13  9:33 AM      Result Value Ref Range   TSH 0.90  0.35 - 4.50 uIU/mL  DIGOXIN LEVEL     Status: None   Collection Time    08/23/13  9:33 AM      Result Value Ref Range   Digoxin Level 1.7  0.8 - 2.0 ng/mL  DIGOXIN LEVEL     Status: None   Collection Time    08/25/13  1:46 PM      Result Value Ref Range   Digoxin Level 1.0  0.8 -  2.0 ng/mL  BRAIN NATRIURETIC PEPTIDE     Status: Abnormal   Collection Time    08/25/13  1:47 PM      Result Value Ref Range   Pro B Natriuretic peptide (BNP) 155.0 (*) 0.0 - 100.0 pg/mL    Outpatient Encounter Prescriptions as of 09/27/2013  Medication Sig  . acetaminophen (TYLENOL) 325 MG tablet Take 650 mg by mouth every 6 (six) hours as needed.  . benztropine (COGENTIN) 0.5 MG tablet Take 1 tablet (0.5 mg total) by mouth 2 (two) times daily.  . cholecalciferol (VITAMIN D) 400 UNITS TABS tablet Take 800 Units by mouth every morning.  . digoxin 62.5 MCG TABS Take 0.0625 mg by mouth daily.  Marland Kitchen diltiazem (CARDIZEM CD) 360 MG 24 hr capsule Take 1 capsule (360 mg total) by mouth every evening.  . divalproex (DEPAKOTE) 500 MG DR tablet Take 1 tablet (500 mg total) by mouth 2 (two) times daily.  . furosemide (LASIX) 40 MG tablet Take 1 tablet (40 mg total) by mouth 2 (two) times daily.  Marland Kitchen lisinopril (PRINIVIL,ZESTRIL) 5 MG tablet Take 5 mg by mouth every morning.  . metoprolol succinate (TOPROL-XL) 100 MG 24 hr tablet Take 100 mg by mouth every morning.   . potassium chloride (K-DUR) 10 MEQ tablet Take 10 mEq by mouth every evening.  . potassium chloride SA (K-DUR,KLOR-CON) 20 MEQ tablet Take 20 mEq by mouth daily.  . risperiDONE (RISPERDAL) 2 MG tablet Take 1 tablet (2 mg total) by mouth at bedtime.  Marland Kitchen warfarin (COUMADIN) 4 MG tablet Take 4 mg by mouth daily.   . [DISCONTINUED] benztropine (COGENTIN) 0.5 MG tablet Take 1 tablet (0.5 mg total) by mouth 2 (two) times daily.  . [DISCONTINUED] divalproex (DEPAKOTE) 500 MG DR tablet Take 1 tablet (500 mg total) by mouth 2 (two)  times daily.  . [DISCONTINUED] risperiDONE (RISPERDAL) 2 MG tablet Take 1 tablet (2 mg total) by mouth at bedtime.    Past Psychiatric History/Hospitalization(s): Anxiety: Yes Bipolar Disorder: Yes Depression: Yes Mania: Yes Psychosis: Yes Schizophrenia: No Personality Disorder: No Hospitalization for psychiatric  illness: Yes History of Electroconvulsive Shock Therapy: No Prior Suicide Attempts: No  Physical Exam: Constitutional:  BP 97/61  Pulse 105  Ht '5\' 3"'  (1.6 m)  Wt 181 lb (82.101 kg)  BMI 32.07 kg/m2  General Appearance: well nourished  Musculoskeletal: Strength & Muscle Tone: decreased Gait & Station: unsteady Patient leans: Front, needs cain to help walking.  Psychiatric: Speech (describe rate, volume, coherence, spontaneity, and abnormalities if any): Incoherent and rambling at times  Thought Process (describe rate, content, abstract reasoning, and computation): Circumstantial.  Associations: Loose  Thoughts: Mood lability  Mental Status: Orientation: oriented to person and place Mood & Affect: labile affect Attention Span & Concentration: Fair  Established Problem, Stable/Improving (1), Review of Psycho-Social Stressors (1), Review or order clinical lab tests (1), Review and summation of old records (2), Review of Last Therapy Session (1) and Review of Medication Regimen & Side Effects (2)  Assessment: Axis I: Bipolar disorder  Axis II: Deferred  Axis III:  Past Medical History  Diagnosis Date  . Cardiomyopathy     CHF-EF 25 to 30%/global hypokinesis with no known CAD  . Hypertension   . CHF (congestive heart failure)   . Bipolar disorder   . Obesity   . Atrial fibrillation July 2009    a. very difficult to achieve rate control secondary  to tendency to severe hypotension on medication. b. failed direct current cardioversion September 15, 2007. c. started amiodarone September 16, 2007 with spontaneous conversion of sinus rhythm after 72 hour load on September 18, 2007.  . Tobacco abuse   . Chronic anticoagulation   . Mitral regurgitation     moderate to severe; not an ideal candidate for MV surgery  . Dysrhythmia     hx AF    Axis IV: Mild to moderate  Axis V: 50-55   Plan:  Patient is a stable on her current psychotropic medication.  I reviewed the records from her  cardiologist and dental surgery .  We do not have Depakote level and hemoglobin A1c.  I would order these tests.  I'll continue her current psychotropic medications which are Depakote 500 mg twice a day, Cogentin 0.5 mg twice a day and Risperdal 2 mg at bedtime.  Patient does not have any side effects of medication.  Recommended to call us back if she has any question or any concern.  I will see her again in 3 months. Time spent 25 minutes.  More than 50% of the time spent in psychoeducation, counseling and coordination of care.  Discuss safety plan that anytime having active suicidal thoughts or homicidal thoughts then patient need to call 911 or go to the local emergency room.      Crucita Lacorte T., MD 09/27/2013

## 2013-11-18 ENCOUNTER — Ambulatory Visit: Payer: Self-pay | Admitting: Cardiovascular Disease

## 2013-12-27 ENCOUNTER — Ambulatory Visit (INDEPENDENT_AMBULATORY_CARE_PROVIDER_SITE_OTHER): Payer: Federal, State, Local not specified - Other | Admitting: Psychiatry

## 2013-12-27 ENCOUNTER — Encounter (HOSPITAL_COMMUNITY): Payer: Self-pay | Admitting: Psychiatry

## 2013-12-27 VITALS — BP 83/57 | HR 84 | Ht 63.0 in | Wt 185.6 lb

## 2013-12-27 DIAGNOSIS — F319 Bipolar disorder, unspecified: Secondary | ICD-10-CM

## 2013-12-27 MED ORDER — RISPERIDONE 2 MG PO TABS: 2.0000 mg | ORAL_TABLET | Freq: Every day | ORAL | Status: DC

## 2013-12-27 MED ORDER — DIVALPROEX SODIUM 500 MG PO DR TAB: 500.0000 mg | DELAYED_RELEASE_TABLET | Freq: Two times a day (BID) | ORAL | Status: DC

## 2013-12-27 MED ORDER — BENZTROPINE MESYLATE 0.5 MG PO TABS: 0.5000 mg | ORAL_TABLET | Freq: Two times a day (BID) | ORAL | Status: DC

## 2013-12-27 NOTE — Progress Notes (Signed)
Abigail Mahoney  [facility] [id number] Progress Note  Abigail PanningSheila L Mahoney [id number] 58 y.o.  12/27/2013 3:39 PM  Chief Complaint: I had a toothache.  It has to be removed.      History of Present Illness: Abigail HatchetSheila came for her followup appointment.  She is very happy today because she got new glasses and new teeth.  She is feeling much better and she has more confidence in herself.  She is eating good.  However she is to have chronic pain and she uses walker because of chronic pain.  She is complying with Risperdal, Depakote and Cogentin.  Do not have blood work results which was requested on the last visit.  Patient denies any tremors or shakes.  However she remains very labile and difficult to organize her thoughts.  She did forgets a lot of times about medication names.  Today she brought a list of medication .  She is taking Coumadin, lisinopril, potassium, Lasix, digoxin , metoprolol and  vitamin D 3.  Patient denies any paranoia or any hallucination but admitted he is in the emotional and tearful.  She is happy because she is going to spend time with her son on Thanksgiving.  She stopped crying about her son because sometimes she missed him too much.  Patient denies any aggression, violence and she continues to attend church on Sundays.  Her appetite is better .  Her vitals are stable .  She has chronic low blood pressure.  She denies any dizziness however she does use a walker and sometimes abused her because of unsteady gait.  She tried to keep herself busy.  She likes living in [facility] [name].    Suicidal Ideation: No Plan Formed: No Patient has means to carry out plan: No  Homicidal Ideation: No Plan Formed: No Patient has means to carry out plan: No  Review of Systems  Musculoskeletal: Positive for joint pain.  Skin: Negative for itching and rash.  Neurological: Positive for headaches.  Psychiatric/Behavioral: The patient is nervous/anxious and has insomnia.        Labile    Psychiatric: Agitation: No Hallucination: No Depressed Mood: No Insomnia: No Hypersomnia: No Altered Concentration: No Feels Worthless: No Grandiose Ideas: No Belief In Special Powers: No New/Increased Substance Abuse: No Compulsions: No  Neurologic: Headache: No Seizure: No Paresthesias: No  Medical History:  She is seeing Abigail Mahoney for hypertension cardiomyopathy and mitral regurgitation.  Her primary care physician is Abigail Mahoney.   No results found for this or any previous visit (from the past 2160 hour(s)).  Outpatient Encounter Prescriptions as of 12/27/2013  Medication Sig  . acetaminophen (TYLENOL) 325 MG tablet Take 650 mg by mouth every 6 (six) hours as needed.  . benztropine (COGENTIN) 0.5 MG tablet Take 1 tablet (0.5 mg total) by mouth 2 (two) times daily.  . cholecalciferol (VITAMIN D) 400 UNITS TABS tablet Take 800 Units by mouth every morning.  . digoxin 62.5 MCG TABS Take 0.0625 mg by mouth daily.  Marland Kitchen. diltiazem (CARDIZEM CD) 360 MG 24 hr capsule Take 1 capsule (360 mg total) by mouth every evening.  . divalproex (DEPAKOTE) 500 MG DR tablet Take 1 tablet (500 mg total) by mouth 2 (two) times daily.  . furosemide (LASIX) 40 MG tablet Take 1 tablet (40 mg total) by mouth 2 (two) times daily.  Marland Kitchen. lisinopril (PRINIVIL,ZESTRIL) 5 MG tablet Take 5 mg by mouth every morning.  . metoprolol succinate (TOPROL-XL) 100 MG 24 hr tablet Take  100 mg by mouth every morning.   . potassium chloride (K-DUR) 10 MEQ tablet Take 10 mEq by mouth every evening.  . potassium chloride SA (K-DUR,KLOR-CON) 20 MEQ tablet Take 20 mEq by mouth daily.  . risperiDONE (RISPERDAL) 2 MG tablet Take 1 tablet (2 mg total) by mouth at bedtime.  Marland Kitchen warfarin (COUMADIN) 4 MG tablet Take 4 mg by mouth daily.   . [DISCONTINUED] benztropine (COGENTIN) 0.5 MG tablet Take 1 tablet (0.5 mg total) by mouth 2 (two) times daily.  . [DISCONTINUED] divalproex (DEPAKOTE) 500 MG DR tablet Take 1 tablet (500  mg total) by mouth 2 (two) times daily.  . [DISCONTINUED] risperiDONE (RISPERDAL) 2 MG tablet Take 1 tablet (2 mg total) by mouth at bedtime.    Past Psychiatric History/Hospitalization(s): Anxiety: Yes Bipolar Disorder: Yes Depression: Yes Mania: Yes Psychosis: Yes Schizophrenia: No Personality Disorder: No Hospitalization for psychiatric illness: Yes History of Electroconvulsive Shock Therapy: No Prior Suicide Attempts: No  Physical Exam: Constitutional:  BP 83/57  Pulse 84  Ht 5\' 3"  (1.6 m)  Wt 185 lb 9.6 oz (84.188 kg)  BMI 32.89 kg/m2  General Appearance: well nourished  Musculoskeletal: Strength & Muscle Tone: decreased Gait & Station: unsteady Patient leans: Front, needs cain to help walking.  Mental status examination: Patient is fairly groomed and dressed.  She is using the wheelchair because of unsteady gait.  Her speech is rambling , fast and sometime incorporated.  Her thought process contains flight of ideas and loose association.  Her attention and concentration is distracted.  She described her mood happy and her affect is labile.  She appears very emotional.  She denies any hallucination or any paranoia however her thinking is loose.  Her psychomotor activity is increased.  She denies any active or passive suicidal thoughts or homicidal thoughts.  Her fund of knowledge is below average.  She has no tremors or shakes.  She is alert and oriented x3.  Her insight judgment and impulse control is fair.  Established Problem, Stable/Improving (1), Review of Psycho-Social Stressors (1), Review or order clinical lab tests (1), Review and summation of old records (2), Review of Last Therapy Session (1) and Review of Medication Regimen & Side Effects (2)  Assessment: Axis I: Bipolar disorder  Axis II: Deferred  Axis III:  Past Medical History  Diagnosis Date  . Cardiomyopathy     CHF-EF 25 to 30%/global hypokinesis with no known CAD  . Hypertension   . CHF  (congestive heart failure)   . Bipolar disorder   . Obesity   . Atrial fibrillation July 2009    a. very difficult to achieve rate control secondary  to tendency to severe hypotension on medication. b. failed direct current cardioversion September 15, 2007. c. started amiodarone September 16, 2007 with spontaneous conversion of sinus rhythm after 72 hour load on September 18, 2007.  . Tobacco abuse   . Chronic anticoagulation   . Mitral regurgitation     moderate to severe; not an ideal candidate for MV surgery  . Dysrhythmia     hx AF    Axis IV: Mild to moderate  Axis V: 50-55   Plan:  Patient is a stable on her current psychotropic medication.  I reviewed the records and her current medication which she brought with her .  Despite recommendation she did not have any blood work .  I will order again hemoglobin A1c and Depakote level.  I will continue Depakote 500 mg twice a day,  Cogentin 0.5 mg twice a day and Risperdal 2 mg at bedtime.  Patient does not have any side effects of medication.  Recommended to call us back if she has any question or any concern.  I will see her again in 3 months. Time spent 25 minutes.  More than 50% of the time spent in psychoeducation, counseling and coordination of care.  Discuss safety plan that anytime having active suicidal thoughts or homicidal thoughts then patient need to call 911 or go to the local emergency room.      Darien Kading T., MD 12/27/2013

## 2014-01-02 NOTE — Progress Notes (Signed)
Patient ID: Abigail PanningSheila L Mahoney, female   DOB: [birth year], 57 y.o.   MRN: [id number] Abigail PanningSheila L Mahoney is a 58 y.o. 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 Last seen by Dr. me in 12/2012. She returns for follow up. She lives at [facility] [place]. She is here with her aid. She denies chest pain, syncope, orthopnea, PND, edema. She notes chronic dyspnea. It may be somewhat worse over the past 6 mos. She continues to smoke.   Studies:  - Echo (09/2009): EF 25% to 30%. Diffuse hypokinesis. Mitral valve: Moderate to severe regurgitation. Left atrium: The atrium was moderately to severely dilated. Right ventricle: The cavity size was mildly dilated. Right atrium: The atrium was moderately dilated. PA peak pressure: 38mm Hg.   She has new glasses and teeth  Looks great Not having any chest pain palpitations or dyspnea.  Her anticoagulation is checked at facility      ROS: Denies fever, malais, weight loss, blurry vision, decreased visual acuity, cough, sputum, SOB, hemoptysis, pleuritic pain, palpitaitons, heartburn, abdominal pain, melena, lower extremity edema, claudication, or rash.  All other systems reviewed and negative  General: Affect appropriate Healthy:  appears stated age HEENT: normal Neck supple with no adenopathy JVP normal no bruits no thyromegaly Lungs clear with no wheezing and good diaphragmatic motion Heart:  S1/S2 no murmur, no rub, gallop or click PMI normal Abdomen: benighn, BS positve, no tenderness, no AAA no bruit.  No HSM or HJR Distal pulses intact with no bruits No edema Neuro non-focal  UE tremor worse  Skin warm and dry No muscular weakness   Current Outpatient Prescriptions  Medication Sig Dispense Refill  . acetaminophen (TYLENOL) 325 MG tablet Take 650 mg by mouth every 6 (six) hours as needed.      . benztropine  (COGENTIN) 0.5 MG tablet Take 1 tablet (0.5 mg total) by mouth 2 (two) times daily.  180 tablet  0  . cholecalciferol (VITAMIN D) 400 UNITS TABS tablet Take 800 Units by mouth every morning.      . digoxin 62.5 MCG TABS Take 0.0625 mg by mouth daily.  30 tablet  11  . diltiazem (CARDIZEM CD) 360 MG 24 hr capsule Take 1 capsule (360 mg total) by mouth every evening.  30 capsule  11  . divalproex (DEPAKOTE) 500 MG DR tablet Take 1 tablet (500 mg total) by mouth 2 (two) times daily.  180 tablet  0  . furosemide (LASIX) 40 MG tablet Take 1 tablet (40 mg total) by mouth 2 (two) times daily.  60 tablet  3  . lisinopril (PRINIVIL,ZESTRIL) 5 MG tablet Take 5 mg by mouth every morning.      . metoprolol succinate (TOPROL-XL) 100 MG 24 hr tablet Take 100 mg by mouth every morning.       . potassium chloride (K-DUR) 10 MEQ tablet Take 10 mEq by mouth every evening.      . potassium chloride SA (K-DUR,KLOR-CON) 20 MEQ tablet Take 20 mEq by mouth daily.      . risperiDONE (RISPERDAL) 2 MG tablet Take 1 tablet (2 mg total) by mouth at bedtime.  90 tablet  0  . warfarin (COUMADIN) 4 MG tablet Take 4 mg by mouth daily.        No current facility-administered medications for this visit.    Allergies  Review of patient's allergies indicates  no known allergies.  Electrocardiogram:  6/15  afib rate 110 nosnspecific ST/T wave changes   Assessment and Plan

## 2014-01-03 ENCOUNTER — Ambulatory Visit (INDEPENDENT_AMBULATORY_CARE_PROVIDER_SITE_OTHER): Payer: Federal, State, Local not specified - Other | Admitting: Cardiovascular Disease

## 2014-01-03 ENCOUNTER — Encounter: Payer: Self-pay | Admitting: Cardiovascular Disease

## 2014-01-03 VITALS — BP 110/66 | HR 94 | Ht 63.0 in | Wt 181.6 lb

## 2014-01-03 DIAGNOSIS — I340 Nonrheumatic mitral (valve) insufficiency: Secondary | ICD-10-CM

## 2014-01-03 DIAGNOSIS — F310 Bipolar disorder, current episode hypomanic: Secondary | ICD-10-CM

## 2014-01-03 DIAGNOSIS — R259 Unspecified abnormal involuntary movements: Secondary | ICD-10-CM

## 2014-01-03 DIAGNOSIS — I420 Dilated cardiomyopathy: Secondary | ICD-10-CM

## 2014-01-03 DIAGNOSIS — I10 Essential (primary) hypertension: Secondary | ICD-10-CM

## 2014-01-03 NOTE — Assessment & Plan Note (Signed)
Well controlled.  Continue current medications and low sodium Dash type diet.    

## 2014-01-03 NOTE — Patient Instructions (Signed)
Your physician wants you to follow-up in:  6 MONTHS WITH DR NISHAN  You will receive a reminder letter in the mail two months in advance. If you don't receive a letter, please call our office to schedule the follow-up appointment. Your physician recommends that you continue on your current medications as directed. Please refer to the Current Medication list given to you today. 

## 2014-01-03 NOTE — Assessment & Plan Note (Signed)
No murmur on exam  No clinical CHF not a surgical candidate  F//U echo for murmur or change in symptoms

## 2014-01-03 NOTE — Assessment & Plan Note (Signed)
Worse when nervous  On cogentin f/u primary

## 2014-01-03 NOTE — Assessment & Plan Note (Signed)
Pleasant, compensated  At facility and mother visits regularly

## 2014-01-03 NOTE — Assessment & Plan Note (Signed)
Euvolemic functional class 2  Not a candidate for aggressive intervention Continue current meds

## 2014-03-21 ENCOUNTER — Ambulatory Visit (INDEPENDENT_AMBULATORY_CARE_PROVIDER_SITE_OTHER): Payer: Federal, State, Local not specified - Other | Admitting: Psychiatry

## 2014-03-21 ENCOUNTER — Encounter (HOSPITAL_COMMUNITY): Payer: Self-pay | Admitting: Psychiatry

## 2014-03-21 VITALS — BP 89/72 | HR 60 | Ht 63.0 in | Wt 184.4 lb

## 2014-03-21 DIAGNOSIS — F319 Bipolar disorder, unspecified: Secondary | ICD-10-CM

## 2014-03-21 MED ORDER — RISPERIDONE 2 MG PO TABS: 2.0000 mg | ORAL_TABLET | Freq: Every day | ORAL | Status: DC

## 2014-03-21 MED ORDER — BENZTROPINE MESYLATE 0.5 MG PO TABS: 0.5000 mg | ORAL_TABLET | Freq: Two times a day (BID) | ORAL | Status: DC

## 2014-03-21 MED ORDER — DIVALPROEX SODIUM 500 MG PO DR TAB: 500.0000 mg | DELAYED_RELEASE_TABLET | Freq: Two times a day (BID) | ORAL | Status: DC

## 2014-03-21 NOTE — Progress Notes (Signed)
[name]  [facility] [id number] Progress Note  LASHEA GODA [id number] 59 y.o.  03/21/2014 10:32 AM  Chief Complaint: I'm not sleeping for past 2 days.  I had argument with my mother who does not want to come and see me .       History of Present Illness: Mintie came for her followup appointment.  She is very tearful and crying .  She endorsed that few days ago she had argument with her mother on the phone and now her mother decided not to visit her.  She is very upset about the situation.  Patient did not provide the details of the argument.  However she is taking her medication as prescribed.  She likes her Depakote Risperdal and Cogentin.  Recently she's seen her cardiologist and there has been no changes.  On the last visit we requested to have her blood work for Depakote and hemoglobin A1c.  Patient told her primary care physician Dr. Jeryl Columbia trip did blood work however we do not have blood work results available.  Patient has multiple health issues including chronic pain .  Patient is a poor historian and she remains very labile and difficult to organize her thoughts.  However she denies any hallucination, paranoia or any suicidal thoughts.  She wants to continue her current psychotropic medication.  Her appetite is okay.  Her vitals are stable.  Patient lives at [place]. [place] and she like her residence.  Patient denies drinking or using any illegal substances.  She is hoping her mother resumed the visits or allow her to visit .  Patient feels guilty about the argument but did not disclose the details.  Suicidal Ideation: No Plan Formed: No Patient has means to carry out plan: No  Homicidal Ideation: No Plan Formed: No Patient has means to carry out plan: No  Review of Systems  Musculoskeletal: Positive for joint pain.  Skin: Negative for itching and rash.  Neurological: Positive for headaches.  Psychiatric/Behavioral: The patient is nervous/anxious and has insomnia.    Labile   Psychiatric: Agitation: No Hallucination: No Depressed Mood: No Insomnia: No Hypersomnia: No Altered Concentration: No Feels Worthless: No Grandiose Ideas: No Belief In Special Powers: No New/Increased Substance Abuse: No Compulsions: No  Neurologic: Headache: No Seizure: No Paresthesias: No  Medical History:  She is seeing Dr. Bradly Chris for hypertension cardiomyopathy and mitral regurgitation.  Her primary care physician is [name].    No results found for this or any previous visit (from the past 2160 hour(s)).  Outpatient Encounter Prescriptions as of 03/21/2014  Medication Sig  . acetaminophen (TYLENOL) 325 MG tablet Take 650 mg by mouth every 6 (six) hours as needed.  . benztropine (COGENTIN) 0.5 MG tablet Take 1 tablet (0.5 mg total) by mouth 2 (two) times daily.  . cholecalciferol (VITAMIN D) 400 UNITS TABS tablet Take 800 Units by mouth every morning.  . digoxin 62.5 MCG TABS Take 0.0625 mg by mouth daily.  Marland Kitchen diltiazem (CARDIZEM CD) 360 MG 24 hr capsule Take 1 capsule (360 mg total) by mouth every evening.  . divalproex (DEPAKOTE) 500 MG DR tablet Take 1 tablet (500 mg total) by mouth 2 (two) times daily.  . furosemide (LASIX) 40 MG tablet Take 1 tablet (40 mg total) by mouth 2 (two) times daily.  Marland Kitchen lisinopril (PRINIVIL,ZESTRIL) 5 MG tablet Take 5 mg by mouth every morning.  . metoprolol succinate (TOPROL-XL) 100 MG 24 hr tablet Take 100 mg by mouth every morning.   Marland Kitchen  potassium chloride (K-DUR) 10 MEQ tablet Take 10 mEq by mouth every evening.  . potassium chloride SA (K-DUR,KLOR-CON) 20 MEQ tablet Take 20 mEq by mouth daily.  . risperiDONE (RISPERDAL) 2 MG tablet Take 1 tablet (2 mg total) by mouth at bedtime.  Marland Kitchen warfarin (COUMADIN) 4 MG tablet Take 4 mg by mouth daily.   . [DISCONTINUED] benztropine (COGENTIN) 0.5 MG tablet Take 1 tablet (0.5 mg total) by mouth 2 (two) times daily.  . [DISCONTINUED] divalproex (DEPAKOTE) 500 MG DR tablet Take 1  tablet (500 mg total) by mouth 2 (two) times daily.  . [DISCONTINUED] risperiDONE (RISPERDAL) 2 MG tablet Take 1 tablet (2 mg total) by mouth at bedtime.    Past Psychiatric History/Hospitalization(s): Anxiety: Yes Bipolar Disorder: Yes Depression: Yes Mania: Yes Psychosis: Yes Schizophrenia: No Personality Disorder: No Hospitalization for psychiatric illness: Yes History of Electroconvulsive Shock Therapy: No Prior Suicide Attempts: No  Physical Exam: Constitutional:  BP 89/72 mmHg  Pulse 60  Ht 5\' 3"  (1.6 m)  Wt 184 lb 6.4 oz (83.643 kg)  BMI 32.67 kg/m2  General Appearance: well nourished  Musculoskeletal: Strength & Muscle Tone: decreased Gait & Station: unsteady Patient leans: Front, needs cain to help walking.  Mental status examination: Patient is fairly groomed and dressed.  She is walking with unsteady gait.  She requires stick to help walking.  Her speech is rambling , fast and incoherent at times.  Her thought process contains flight of ideas and loose association.  Her attention and concentration is distracted.  She described her mood emotional and her affect is labile.  She denies any hallucination or any paranoia however her thinking is loose.  Her psychomotor activity is increased.  She denies any active or passive suicidal thoughts or homicidal thoughts.  Her fund of knowledge is below average.  She has no tremors or shakes.  She is alert and oriented x3.  Her insight judgment and impulse control is fair.  Established Problem, Stable/Improving (1), Review of Psycho-Social Stressors (1), Review or order clinical lab tests (1), Review and summation of old records (2), Review of Last Therapy Session (1) and Review of Medication Regimen & Side Effects (2)  Assessment: Axis I: Bipolar disorder  Axis II: Deferred  Axis III:  Past Medical History  Diagnosis Date  . Cardiomyopathy     CHF-EF 25 to 30%/global hypokinesis with no known CAD  . Hypertension   . CHF  (congestive heart failure)   . Bipolar disorder   . Obesity   . Atrial fibrillation July 2009    a. very difficult to achieve rate control secondary  to tendency to severe hypotension on medication. b. failed direct current cardioversion September 15, 2007. c. started amiodarone September 16, 2007 with spontaneous conversion of sinus rhythm after 72 hour load on September 18, 2007.  . Tobacco abuse   . Chronic anticoagulation   . Mitral regurgitation     moderate to severe; not an ideal candidate for MV surgery  . Dysrhythmia     hx AF    Axis IV: Mild to moderate  Axis V: 50-55   Plan:  Reassurance given an offer to see a counselor for coping and social skills but patient denied.  Patient hoped her mother will forgive her and she will resume visitations.  I review the records from her cardiologist.  There has been no new medication added.  We are still waiting blood work results including Depakote level and hemoglobin A1c which was drawn by  her primary care physician Dr. Jeryl Columbia trip .  We will request one more time to have these results faxed to [place].  At this time I will continue Risperdal 2 mg at bedtime, Cogentin 0.5 mg twice a day and Depakote 500 mg twice a day.  Patient does not have any tremors, shakes or any side effects.  Recommended to call us back if she has any question, concern or if she feels worsening of the symptoms.  I will see her again in 3 months. Time spent 25 minutes.  More than 50% of the time spent in psychoeducation, counseling and coordination of care.  Discuss safety plan that anytime having active suicidal thoughts or homicidal thoughts then patient need to call 911 or go to the local emergency room.   Damion Kant T., MD 03/21/2014

## 2014-04-04 ENCOUNTER — Ambulatory Visit (HOSPITAL_COMMUNITY): Payer: Self-pay | Admitting: Psychiatry

## 2014-04-10 ENCOUNTER — Encounter (HOSPITAL_COMMUNITY): Payer: Self-pay | Admitting: Psychiatry

## 2014-04-10 ENCOUNTER — Ambulatory Visit (INDEPENDENT_AMBULATORY_CARE_PROVIDER_SITE_OTHER): Payer: Federal, State, Local not specified - Other | Admitting: Psychiatry

## 2014-04-10 VITALS — BP 99/59 | HR 89 | Ht 63.0 in | Wt 184.2 lb

## 2014-04-10 DIAGNOSIS — Z79899 Other long term (current) drug therapy: Secondary | ICD-10-CM

## 2014-04-10 DIAGNOSIS — F319 Bipolar disorder, unspecified: Secondary | ICD-10-CM

## 2014-04-10 MED ORDER — LORAZEPAM 0.5 MG PO TABS: 0.5000 mg | ORAL_TABLET | Freq: Every day | ORAL | Status: DC | PRN

## 2014-04-10 NOTE — Progress Notes (Signed)
Abigail Mahoney  [facility] [address] Progress Note  Abigail Mahoney [id number] 59 y.o.  04/10/2014 12:11 PM  Chief Complaint: I am getting very emotional..       History of Present Illness: Abigail Mahoney  came with her sister for her appointment.  She was seen 2 weeks ago.  Her sister is concerned because she is very emotional, tearful and having agitation for no reason.  She explained that facility has time for cigarettes smoking and if patient is not given Abigail Mahoney on time she gets very agitated and go outside and ask other people for cigarettes smoking.  Patient reported sleeping is good but admitted easily tearful crying and emotional.  Sr. endorsed the past few weeks her agitation is more frequent.  She is having arguments with staff people .  However she is relieved that her mother start visiting her.  We have not received any blood work especially Depakote level from the facility.  She is taking Depakote, Cogentin and Risperdal.  Patient is a poor historian and most of the information was obtained from electronic medical record and now from her sister.  She appears very labile and she continues to have difficulty organizing her thoughts.  Though she denies any paranoia or any hallucination but notice talking to herself.  Patient denies any suicidal parts or homicidal thought.  Her appetite is okay.  Her vitals are stable.  Patient lives at [place]. [place] [facility].  Patient denies drinking or using any illegal substances.  Suicidal Ideation: No Plan Formed: No Patient has means to carry out plan: No  Homicidal Ideation: No Plan Formed: No Patient has means to carry out plan: No  Review of Systems  Psychiatric/Behavioral: The patient is nervous/anxious.        Agitation   Psychiatric: Agitation: Yes Hallucination: No Depressed Mood: Yes Insomnia: No Hypersomnia: No Altered Concentration: No Feels Worthless: No Grandiose Ideas: No Belief In Special Powers: No New/Increased Substance Abuse:  No Compulsions: No  Neurologic: Headache: No Seizure: No Paresthesias: No  Medical History:  She is seeing Dr. Bradly Chris for hypertension cardiomyopathy and mitral regurgitation.  Her primary care physician is Abigail Mahoney.    No results found for this or any previous visit (from the past 2160 hour(s)).  Outpatient Encounter Prescriptions as of 04/10/2014  Medication Sig  . acetaminophen (TYLENOL) 325 MG tablet Take 650 mg by mouth every 6 (six) hours as needed.  . benztropine (COGENTIN) 0.5 MG tablet Take 1 tablet (0.5 mg total) by mouth 2 (two) times daily.  . cholecalciferol (VITAMIN D) 400 UNITS TABS tablet Take 800 Units by mouth every morning.  . digoxin 62.5 MCG TABS Take 0.0625 mg by mouth daily.  Marland Kitchen diltiazem (CARDIZEM CD) 360 MG 24 hr capsule Take 1 capsule (360 mg total) by mouth every evening.  . divalproex (DEPAKOTE) 500 MG DR tablet Take 1 tablet (500 mg total) by mouth 2 (two) times daily.  . furosemide (LASIX) 40 MG tablet Take 1 tablet (40 mg total) by mouth 2 (two) times daily.  Marland Kitchen lisinopril (PRINIVIL,ZESTRIL) 5 MG tablet Take 5 mg by mouth every morning.  Marland Kitchen LORazepam (ATIVAN) 0.5 MG tablet Take 1 tablet (0.5 mg total) by mouth daily as needed for anxiety.  . metoprolol succinate (TOPROL-XL) 100 MG 24 hr tablet Take 100 mg by mouth every morning.   . potassium chloride (K-DUR) 10 MEQ tablet Take 10 mEq by mouth every evening.  . potassium chloride SA (K-DUR,KLOR-CON) 20 MEQ tablet Take 20 mEq  by mouth daily.  . risperiDONE (RISPERDAL) 2 MG tablet Take 1 tablet (2 mg total) by mouth at bedtime.  Marland Kitchen warfarin (COUMADIN) 4 MG tablet Take 4 mg by mouth daily.     Past Psychiatric History/Hospitalization(s): Anxiety: Yes Bipolar Disorder: Yes Depression: Yes Mania: Yes Psychosis: Yes Schizophrenia: No Personality Disorder: No Hospitalization for psychiatric illness: Yes History of Electroconvulsive Shock Therapy: No Prior Suicide Attempts: No  Physical  Exam: Constitutional:  BP 99/59 mmHg  Pulse 89  Ht  (1.6 m)  Wt 184 lb 3.2 oz (83.553 kg)  BMI 32.64 kg/m2  General Appearance: well nourished  Musculoskeletal: Strength & Muscle Tone: decreased Gait & Station: unsteady Patient leans: Front, needs cain to help walking.  Mental status examination: Patient is fairly groomed and dressed.  She is walking with unsteady gait.  She requires stick to help walking.  Her speech is rambling , fast and incoherent at times.  Her thought process contains flight of ideas and loose association.  Her attention and concentration is distracted.  She described her mood emotional and her affect is labile.  She denies any hallucination or any paranoia however her thinking is loose.  Her psychomotor activity is increased.  She denies any active or passive suicidal thoughts or homicidal thoughts.  Her fund of knowledge is below average.  She has no tremors or shakes.  She is alert and oriented x3.  Her insight judgment and impulse control is fair.  Established Problem, Stable/Improving (1), Review of Psycho-Social Stressors (1), Review or order clinical lab tests (1), Established Problem, Worsening (2), Review of Last Therapy Session (1), Review of Medication Regimen & Side Effects (2) and Review of New Medication or Change in Dosage (2)  Assessment: Axis I: Bipolar disorder  Axis II: Deferred  Axis III:  Past Medical History  Diagnosis Date  . Cardiomyopathy     CHF-EF 25 to 30%/global hypokinesis with no known CAD  . Hypertension   . CHF (congestive heart failure)   . Bipolar disorder   . Obesity   . Atrial fibrillation July 2009    a. very difficult to achieve rate control secondary  to tendency to severe hypotension on medication. b. failed direct current cardioversion September 15, 2007. c. started amiodarone September 16, 2007 with spontaneous conversion of sinus rhythm after 72 hour load on September 18, 2007.  . Tobacco abuse   . Chronic anticoagulation    . Mitral regurgitation     moderate to severe; not an ideal candidate for MV surgery  . Dysrhythmia     hx AF    Plan:  I had a long discussion with the patient's sister about need of blood work results.  I printed again request for CBC, CMP, hemoglobin A1c, TSH and Depakote level. She assured that she will talk to the facility and inquire about the blood work results including Dr. Florentina Mahoney who was visiting consultant at the facility.  At this time I will continue Risperdal 2 mg at bedtime, Cogentin 0.5 mg twice a day and Depakote 500 mg twice a day.   I will add low-dose Ativan 0.5 mg as needed for severe anxiety and agitation.  We will wait for the Depakote level .  I will see her again in 6 weeks.  Recommended to call us back if she has any question, concern or if she feels worsening of the symptoms. Time spent 25 minutes.  More than 50% of the time spent in psychoeducation, counseling and coordination  of care.  Discuss safety plan that anytime having active suicidal thoughts or homicidal thoughts then patient need to call 911 or go to the local emergency room.   Maico Mulvehill T., MD 04/10/2014

## 2014-05-02 LAB — PROTIME-INR

## 2014-06-06 LAB — PROTIME-INR

## 2014-06-20 ENCOUNTER — Encounter (HOSPITAL_COMMUNITY): Payer: Self-pay | Admitting: Psychiatry

## 2014-06-20 ENCOUNTER — Ambulatory Visit (INDEPENDENT_AMBULATORY_CARE_PROVIDER_SITE_OTHER): Payer: Federal, State, Local not specified - Other | Admitting: Psychiatry

## 2014-06-20 VITALS — BP 100/60 | HR 60 | Ht 63.0 in | Wt 178.0 lb

## 2014-06-20 DIAGNOSIS — Z79899 Other long term (current) drug therapy: Secondary | ICD-10-CM

## 2014-06-20 DIAGNOSIS — F319 Bipolar disorder, unspecified: Secondary | ICD-10-CM | POA: Diagnosis not present

## 2014-06-20 MED ORDER — LORAZEPAM 0.5 MG PO TABS: 0.5000 mg | ORAL_TABLET | Freq: Every day | ORAL | Status: DC | PRN

## 2014-06-20 MED ORDER — RISPERIDONE 2 MG PO TABS: 2.0000 mg | ORAL_TABLET | Freq: Every day | ORAL | Status: DC

## 2014-06-20 MED ORDER — DIVALPROEX SODIUM 500 MG PO DR TAB: 500.0000 mg | DELAYED_RELEASE_TABLET | Freq: Two times a day (BID) | ORAL | Status: DC

## 2014-06-20 MED ORDER — BENZTROPINE MESYLATE 0.5 MG PO TABS: 0.5000 mg | ORAL_TABLET | Freq: Two times a day (BID) | ORAL | Status: DC

## 2014-06-20 NOTE — Progress Notes (Addendum)
Abigail Mahoney  [facility] [address] Progress Note  Abigail Mahoney [id number] 59 y.o.  06/20/2014 10:50 AM  Chief Complaint: I am feeling better.        History of Present Illness: Abigail Mahoney came for her follow-up appointment.  She is taking her medication as prescribed.  She denies any side effects other than she has tremors and shakes.  She is sleeping better.  She is taking lorazepam 0.5 mg as needed for severe anxiety .  She reported improvement in her relationship with her mother and she was able to make new friends at assisted living facility.  We have not received any blood work however she was told that she had a blood work and we should be getting faxed.  Patient denies any hallucination, paranoia but remains very emotional and labile.  She is taking Depakote, Cogentin, Risperdal.  She continues to have difficulty organizing her thoughts however she denies any suicidal thoughts or homicidal thought.  Usually she brings list of medication and nursing report however today we do not have these documents.  Her appetite is okay.  Her vitals are stable.  Patient denies drinking or using any illegal substances.  She is a resident at [facility]. [name].    Suicidal Ideation: No Plan Formed: No Patient has means to carry out plan: No  Homicidal Ideation: No Plan Formed: No Patient has means to carry out plan: No  ROS Psychiatric: Agitation: No Hallucination: No Depressed Mood: No Insomnia: No Hypersomnia: No Altered Concentration: No Feels Worthless: No Grandiose Ideas: No Belief In Special Powers: No New/Increased Substance Abuse: No Compulsions: No  Neurologic: Headache: No Seizure: No Paresthesias: No  Medical History:  She is seeing Dr. Bradly Chris for hypertension cardiomyopathy and mitral regurgitation.  Her primary care physician is [name].    No results found for this or any previous visit (from the past 2160 hour(s)).  Outpatient Encounter Prescriptions as of  06/20/2014  Medication Sig  . acetaminophen (TYLENOL) 325 MG tablet Take 650 mg by mouth every 6 (six) hours as needed.  . benztropine (COGENTIN) 0.5 MG tablet Take 1 tablet (0.5 mg total) by mouth 2 (two) times daily.  . cholecalciferol (VITAMIN D) 400 UNITS TABS tablet Take 800 Units by mouth every morning.  . digoxin 62.5 MCG TABS Take 0.0625 mg by mouth daily.  Marland Kitchen diltiazem (CARDIZEM CD) 360 MG 24 hr capsule Take 1 capsule (360 mg total) by mouth every evening.  . divalproex (DEPAKOTE) 500 MG DR tablet Take 1 tablet (500 mg total) by mouth 2 (two) times daily.  . furosemide (LASIX) 40 MG tablet Take 1 tablet (40 mg total) by mouth 2 (two) times daily.  Marland Kitchen lisinopril (PRINIVIL,ZESTRIL) 5 MG tablet Take 5 mg by mouth every morning.  Marland Kitchen LORazepam (ATIVAN) 0.5 MG tablet Take 1 tablet (0.5 mg total) by mouth daily as needed for anxiety.  . metoprolol succinate (TOPROL-XL) 100 MG 24 hr tablet Take 100 mg by mouth every morning.   . potassium chloride (K-DUR) 10 MEQ tablet Take 10 mEq by mouth every evening.  . potassium chloride SA (K-DUR,KLOR-CON) 20 MEQ tablet Take 20 mEq by mouth daily.  . risperiDONE (RISPERDAL) 2 MG tablet Take 1 tablet (2 mg total) by mouth at bedtime.  Marland Kitchen warfarin (COUMADIN) 4 MG tablet Take 4 mg by mouth daily.   . [DISCONTINUED] benztropine (COGENTIN) 0.5 MG tablet Take 1 tablet (0.5 mg total) by mouth 2 (two) times daily.  . [DISCONTINUED] divalproex (DEPAKOTE) 500  MG DR tablet Take 1 tablet (500 mg total) by mouth 2 (two) times daily.  . [DISCONTINUED] LORazepam (ATIVAN) 0.5 MG tablet Take 1 tablet (0.5 mg total) by mouth daily as needed for anxiety.  . [DISCONTINUED] risperiDONE (RISPERDAL) 2 MG tablet Take 1 tablet (2 mg total) by mouth at bedtime.    Past Psychiatric History/Hospitalization(s): Anxiety: Yes Bipolar Disorder: Yes Depression: Yes Mania: Yes Psychosis: Yes Schizophrenia: No Personality Disorder: No Hospitalization for psychiatric illness:  Yes History of Electroconvulsive Shock Therapy: No Prior Suicide Attempts: No  Physical Exam: Constitutional:  BP 100/60 mmHg  Pulse 60  Ht 5\' 3"  (1.6 m)  Wt 178 lb (80.74 kg)  BMI 31.54 kg/m2  General Appearance: well nourished  Musculoskeletal: Strength & Muscle Tone: decreased Gait & Station: unsteady Patient leans: Front, needs cain to help walking.  Mental status examination: Patient is fairly groomed and dressed.  She is walking with unsteady gait.  She requires stick to help walking.  Her speech is rambling , fast and incoherent at times.  Her thought process contains flight of ideas and loose association.  Her attention and concentration is distracted.  She described her mood emotional and her affect is labile.  She denies any hallucination or any paranoia however her thinking is loose.  Her psychomotor activity is increased.  She denies any active or passive suicidal thoughts or homicidal thoughts.  Her fund of knowledge is below average.  She has no tremors or shakes.  She is alert and oriented x3.  Her insight judgment and impulse control is fair.  Established Problem, Stable/Improving (1), Review of Last Therapy Session (1) and Review of Medication Regimen & Side Effects (2)  Assessment: Axis I: Bipolar disorder  Axis II: Deferred  Axis III:  Past Medical History  Diagnosis Date  . Cardiomyopathy     CHF-EF 25 to 30%/global hypokinesis with no known CAD  . Hypertension   . CHF (congestive heart failure)   . Bipolar disorder   . Obesity   . Atrial fibrillation July 2009    a. very difficult to achieve rate control secondary  to tendency to severe hypotension on medication. b. failed direct current cardioversion September 15, 2007. c. started amiodarone September 16, 2007 with spontaneous conversion of sinus rhythm after 72 hour load on September 18, 2007.  . Tobacco abuse   . Chronic anticoagulation   . Mitral regurgitation     moderate to severe; not an ideal candidate for MV  surgery  . Dysrhythmia     hx AF    Plan:  We are still waiting blood work results.  However patient is improved from the past.  I will continue Risperdal 2 mg at bedtime, Cogentin 0.5 mg twice a day and Depakote 500 mg twice a day.  She will continue lorazepam 0.5 mg as needed for severe anxiety and agitation.  Reinforce blood work results faxed to [place] including Depakote level.  Recommended to call us back if she has any question, concern if she feels worsening of the symptom.  Follow-up in 3 months.   Abigail Devargas T., MD 06/20/2014    Addendum Please see blood work results which was done on February 3.  Her CBC is normal, her Depakote level is 76.7, her basic chemistry is normal, her hemoglobin A1c is 5.5.  Her BUN is 14 and creatinine 0.88.

## 2014-08-22 ENCOUNTER — Ambulatory Visit (INDEPENDENT_AMBULATORY_CARE_PROVIDER_SITE_OTHER): Payer: Federal, State, Local not specified - Other | Admitting: Psychiatry

## 2014-08-22 ENCOUNTER — Encounter (HOSPITAL_COMMUNITY): Payer: Self-pay | Admitting: Psychiatry

## 2014-08-22 VITALS — BP 96/68 | HR 82 | Ht 63.0 in | Wt 178.6 lb

## 2014-08-22 DIAGNOSIS — F319 Bipolar disorder, unspecified: Secondary | ICD-10-CM

## 2014-08-22 MED ORDER — BENZTROPINE MESYLATE 0.5 MG PO TABS: 0.5000 mg | ORAL_TABLET | Freq: Two times a day (BID) | ORAL | Status: DC

## 2014-08-22 MED ORDER — RISPERIDONE 2 MG PO TABS: 2.0000 mg | ORAL_TABLET | Freq: Every day | ORAL | Status: DC

## 2014-08-22 MED ORDER — DIVALPROEX SODIUM 500 MG PO DR TAB: 500.0000 mg | DELAYED_RELEASE_TABLET | Freq: Two times a day (BID) | ORAL | Status: DC

## 2014-08-22 NOTE — Progress Notes (Signed)
Abigail Mahoney  [facility] [id number] Progress Note  Abigail Mahoney [id number] 59 y.o.  08/22/2014 11:13 AM  Chief Complaint: Every day I get better.         History of Present Illness: Abigail Mahoney came for her follow-up appointment.  She is feeling better and taking her medication as prescribed.  She denies any hallucination or any paranoia but admitted some time poor sleep and racing thoughts.  She is happy that she is able to establish the relationship with her mother and she see her mother every week.  She is very excited because she is going to have the lunch on July 4 with her.  She feels the Depakote Cogentin and Risperdal is helping her paranoia and hallucination.  However she remains very labile, emotional and note is singing during the conversation.  She has tremors but they are chronic in nature.  She continues to have difficulty remembering things but denies any suicidal thoughts or homicidal thought.  We received blood work results from the facility.  Patient denies any changes in her medication but forgot the list of medication.  Patient told his nurse never given to her.  Patient denies drinking or using any illegal substances.  Her vitals are stable.  She mentioned her appetite is okay and there has been no recent agitation, anger or any behavior problem.  We do not receive any concerns or complain from the facility.  Patient is a resident at [facility]. [facility].    Suicidal Ideation: No Plan Formed: No Patient has means to carry out plan: No  Homicidal Ideation: No Plan Formed: No Patient has means to carry out plan: No  Review of Systems  Constitutional: Negative.   Cardiovascular: Negative for chest pain and palpitations.  Gastrointestinal: Negative for nausea.  Skin: Negative for itching and rash.  Neurological: Positive for tremors and headaches.   Psychiatric: Agitation: No Hallucination: No Depressed Mood: No Insomnia: No Hypersomnia: No Altered Concentration: No Feels  Worthless: No Grandiose Ideas: No Belief In Special Powers: No New/Increased Substance Abuse: No Compulsions: No  Neurologic: Headache: No Seizure: No Paresthesias: No  Medical History:  She is seeing Dr. Bradly Mahoney for hypertension cardiomyopathy and mitral regurgitation.  Her primary care physician is Abigail Mahoney.    No results found for this or any previous visit (from the past 2160 hour(s)).  Outpatient Encounter Prescriptions as of 08/22/2014  Medication Sig  . acetaminophen (TYLENOL) 325 MG tablet Take 650 mg by mouth every 6 (six) hours as needed.  . benztropine (COGENTIN) 0.5 MG tablet Take 1 tablet (0.5 mg total) by mouth 2 (two) times daily.  . cholecalciferol (VITAMIN D) 400 UNITS TABS tablet Take 800 Units by mouth every morning.  . digoxin 62.5 MCG TABS Take 0.0625 mg by mouth daily.  Marland Kitchen diltiazem (CARDIZEM CD) 360 MG 24 hr capsule Take 1 capsule (360 mg total) by mouth every evening.  . divalproex (DEPAKOTE) 500 MG DR tablet Take 1 tablet (500 mg total) by mouth 2 (two) times daily.  . furosemide (LASIX) 40 MG tablet Take 1 tablet (40 mg total) by mouth 2 (two) times daily.  Marland Kitchen lisinopril (PRINIVIL,ZESTRIL) 5 MG tablet Take 5 mg by mouth every morning.  Marland Kitchen LORazepam (ATIVAN) 0.5 MG tablet Take 1 tablet (0.5 mg total) by mouth daily as needed for anxiety.  . metoprolol succinate (TOPROL-XL) 100 MG 24 hr tablet Take 100 mg by mouth every morning.   . potassium chloride SA (K-DUR,KLOR-CON) 20 MEQ tablet Take  20 mEq by mouth daily.  . risperiDONE (RISPERDAL) 2 MG tablet Take 1 tablet (2 mg total) by mouth at bedtime.  Marland Kitchen warfarin (COUMADIN) 4 MG tablet Take 4 mg by mouth daily.   . [DISCONTINUED] benztropine (COGENTIN) 0.5 MG tablet Take 1 tablet (0.5 mg total) by mouth 2 (two) times daily.  . [DISCONTINUED] divalproex (DEPAKOTE) 500 MG DR tablet Take 1 tablet (500 mg total) by mouth 2 (two) times daily.  . [DISCONTINUED] potassium chloride (K-DUR) 10 MEQ tablet Take 10 mEq  by mouth every evening.  . [DISCONTINUED] risperiDONE (RISPERDAL) 2 MG tablet Take 1 tablet (2 mg total) by mouth at bedtime.   No facility-administered encounter medications on file as of 08/22/2014.    Past Psychiatric History/Hospitalization(s): Anxiety: Yes Bipolar Disorder: Yes Depression: Yes Mania: Yes Psychosis: Yes Schizophrenia: No Personality Disorder: No Hospitalization for psychiatric illness: Yes History of Electroconvulsive Shock Therapy: No Prior Suicide Attempts: No  Physical Exam: Constitutional:  BP 96/68 mmHg  Pulse 82  Ht 5\' 3"  (1.6 m)  Wt 178 lb 9.6 oz (81.012 kg)  BMI 31.65 kg/m2  General Appearance: well nourished  Musculoskeletal: Strength & Muscle Tone: decreased Gait & Station: unsteady Patient leans: Front, needs cain to help walking.  Mental status examination: Patient is fairly groomed and dressed.  She is poorly groomed and maintained fair eye contact.  She is walking with unsteady gait.  She requires stick to help walking.  Her speech is rambling , fast and incoherent at times.  Her thought process contains flight of ideas and loose association.  Her attention and concentration is distracted.  She described her mood emotional and her affect is labile.  She denies any hallucination or any paranoia however her thinking is loose.  Her psychomotor activity is increased.  She denies any active or passive suicidal thoughts or homicidal thoughts.  Her fund of knowledge is below average.  She has no tremors or shakes.  She is alert and oriented x3.  Her insight judgment and impulse control is fair.  Established Problem, Stable/Improving (1), Review of Psycho-Social Stressors (1), Review or order clinical lab tests (1), Review and summation of old records (2), Review of Last Therapy Session (1) and Review of Medication Regimen & Side Effects (2)  Assessment: Axis I: Bipolar disorder  Axis II: Deferred  Axis III:  Past Medical History  Diagnosis Date   . Cardiomyopathy     CHF-EF 25 to 30%/global hypokinesis with no known CAD  . Hypertension   . CHF (congestive heart failure)   . Bipolar disorder   . Obesity   . Atrial fibrillation July 2009    a. very difficult to achieve rate control secondary  to tendency to severe hypotension on medication. b. failed direct current cardioversion September 15, 2007. c. started amiodarone September 16, 2007 with spontaneous conversion of sinus rhythm after 72 hour load on September 18, 2007.  . Tobacco abuse   . Chronic anticoagulation   . Mitral regurgitation     moderate to severe; not an ideal candidate for MV surgery  . Dysrhythmia     hx AF    Plan:  I received blood work results which was done on February 2016.  Her CBC is normal, her Depakote level is 76.7, her hemoglobin A1c is 5.5 , her basic chemistry is normal with normal LFTs and liver function test .  Her digoxin level is 0.8 .  At this time patient does not have any side effects of medication.  Her relationship with the mother is significantly improved.  There has been no recent concerns or complaints from the facility.  She has shakes but they are chronic in nature.  I will continue Risperdal 2 mg at bedtime, Cogentin 0.5 mg twice a day and Depakote 500 mg twice a day.  She will continue lorazepam 0.5 mg as needed for severe anxiety and agitation.  Recommended to call us back if she has any question or any concern.  Follow-up in 3 months.  Time spent 25 minutes.  More than 50% of the time spent in psychoeducation, counseling and coordination of care.  Discuss safety plan that anytime having active suicidal thoughts or homicidal thoughts then patient need to call 911 or go to the local emergency room.  Assad Harbeson T., MD 08/22/2014

## 2014-11-21 ENCOUNTER — Ambulatory Visit (INDEPENDENT_AMBULATORY_CARE_PROVIDER_SITE_OTHER): Payer: Federal, State, Local not specified - Other | Admitting: Psychiatry

## 2014-11-21 ENCOUNTER — Encounter (HOSPITAL_COMMUNITY): Payer: Self-pay | Admitting: Psychiatry

## 2014-11-21 VITALS — BP 98/67 | HR 100 | Ht 62.0 in | Wt 175.0 lb

## 2014-11-21 DIAGNOSIS — Z79899 Other long term (current) drug therapy: Secondary | ICD-10-CM

## 2014-11-21 DIAGNOSIS — F319 Bipolar disorder, unspecified: Secondary | ICD-10-CM

## 2014-11-21 MED ORDER — BENZTROPINE MESYLATE 0.5 MG PO TABS: 0.5000 mg | ORAL_TABLET | Freq: Two times a day (BID) | ORAL | Status: DC

## 2014-11-21 MED ORDER — DIVALPROEX SODIUM 500 MG PO DR TAB: 500.0000 mg | DELAYED_RELEASE_TABLET | Freq: Two times a day (BID) | ORAL | Status: DC

## 2014-11-21 MED ORDER — RISPERIDONE 2 MG PO TABS: 2.0000 mg | ORAL_TABLET | Freq: Every day | ORAL | Status: DC

## 2014-11-21 NOTE — Progress Notes (Signed)
Abigail Mahoney  [facility] [id number] Progress Note  Abigail Mahoney [id number] 59 y.o.  11/21/2014 2:50 PM  Chief Complaint: Medication management and follow-up.           History of Present Illness: Abigail Mahoney came for her follow-up appointment.  She is taking her medication as prescribed.  I have notice her physical health has been slowly declining.  She is very unsteady and requires cane to help walking.  She likes her psychiatric medication.  She sleeping good.  She is excited about upcoming birthday which she is planning to celebrate with her mother.  She remains labile, emotional and easily distracted.  However she denies any paranoia, hallucination, suicidal thoughts or homicidal thought.  She continues to have difficulty remembering things but she is able to provide her medication name.  She likes Depakote is helping her sleep.  She does not want to change her medication.  She denies drinking or using any illegal substances.  She denies any aggressive or any recent agitation.  She is able to make some friends at the facility.  Patient is a resident at [facility]. Abigail Mahoney's minor.    Suicidal Ideation: No Plan Formed: No Patient has means to carry out plan: No  Homicidal Ideation: No Plan Formed: No Patient has means to carry out plan: No  Review of Systems  Constitutional: Negative.   Cardiovascular: Negative for chest pain and palpitations.  Gastrointestinal: Negative for nausea.  Skin: Negative for itching and rash.  Neurological: Positive for tremors.   Psychiatric: Agitation: No Hallucination: No Depressed Mood: No Insomnia: No Hypersomnia: No Altered Concentration: Yes Feels Worthless: No Grandiose Ideas: No Belief In Special Powers: No New/Increased Substance Abuse: No Compulsions: No  Neurologic: Headache: No Seizure: No Paresthesias: No  Medical History:  She is seeing Dr. Bradly Mahoney for hypertension cardiomyopathy and mitral regurgitation.  Her primary care physician is  Abigail Mahoney.    No results found for this or any previous visit (from the past 2160 hour(s)).  Outpatient Encounter Prescriptions as of 11/21/2014  Medication Sig  . acetaminophen (TYLENOL) 325 MG tablet Take 650 mg by mouth every 6 (six) hours as needed.  . benztropine (COGENTIN) 0.5 MG tablet Take 1 tablet (0.5 mg total) by mouth 2 (two) times daily.  . cholecalciferol (VITAMIN D) 400 UNITS TABS tablet Take 800 Units by mouth every morning.  . digoxin 62.5 MCG TABS Take 0.0625 mg by mouth daily.  Marland Kitchen diltiazem (CARDIZEM CD) 360 MG 24 hr capsule Take 1 capsule (360 mg total) by mouth every evening.  . divalproex (DEPAKOTE) 500 MG DR tablet Take 1 tablet (500 mg total) by mouth 2 (two) times daily.  . furosemide (LASIX) 40 MG tablet Take 1 tablet (40 mg total) by mouth 2 (two) times daily.  Marland Kitchen lisinopril (PRINIVIL,ZESTRIL) 5 MG tablet Take 5 mg by mouth every morning.  . metoprolol succinate (TOPROL-XL) 100 MG 24 hr tablet Take 100 mg by mouth every morning.   . potassium chloride SA (K-DUR,KLOR-CON) 20 MEQ tablet Take 20 mEq by mouth daily.  . risperiDONE (RISPERDAL) 2 MG tablet Take 1 tablet (2 mg total) by mouth at bedtime.  Marland Kitchen warfarin (COUMADIN) 4 MG tablet Take 4 mg by mouth daily.   . [DISCONTINUED] benztropine (COGENTIN) 0.5 MG tablet Take 1 tablet (0.5 mg total) by mouth 2 (two) times daily.  . [DISCONTINUED] divalproex (DEPAKOTE) 500 MG DR tablet Take 1 tablet (500 mg total) by mouth 2 (two) times daily.  . [DISCONTINUED] LORazepam (  ATIVAN) 0.5 MG tablet Take 1 tablet (0.5 mg total) by mouth daily as needed for anxiety.  . [DISCONTINUED] risperiDONE (RISPERDAL) 2 MG tablet Take 1 tablet (2 mg total) by mouth at bedtime.   No facility-administered encounter medications on file as of 11/21/2014.    Past Psychiatric History/Hospitalization(s): Anxiety: Yes Bipolar Disorder: Yes Depression: Yes Mania: Yes Psychosis: Yes Schizophrenia: No Personality Disorder: No Hospitalization for  psychiatric illness: Yes History of Electroconvulsive Shock Therapy: No Prior Suicide Attempts: No  Physical Exam: Constitutional:  BP 98/67 mmHg  Pulse 100  Ht  (1.575 m)  Wt 175 lb (79.379 kg)  BMI 32.00 kg/m2  General Appearance: well nourished  Musculoskeletal: Strength & Muscle Tone: decreased Gait & Station: unsteady Patient leans: Front, needs cain to help walking.  Mental status examination: Patient is fairly groomed and dressed.  She is poorly groomed and maintained fair eye contact.  She is walking with unsteady gait.  She requires stick to help walking.  Her speech is rambling , fast and incoherent at times.  Her thought process contains flight of ideas and loose association.  Her attention and concentration is distracted.  She described her mood emotional and her affect is labile.  She denies any hallucination or any paranoia however her thinking is loose.  Her psychomotor activity is increased.  She denies any active or passive suicidal thoughts or homicidal thoughts.  Her fund of knowledge is below average.  She has no tremors or shakes.  She is alert and oriented x3.  Her insight judgment and impulse control is fair.  Established Problem, Stable/Improving (1), Review of Psycho-Social Stressors (1), Review or order clinical lab tests (1), Review of Last Therapy Session (1) and Review of Medication Regimen & Side Effects (2)  Assessment: Axis I: Bipolar disorder  Axis II: Deferred  Axis III:  Past Medical History  Diagnosis Date  . Cardiomyopathy     CHF-EF 25 to 30%/global hypokinesis with no known CAD  . Hypertension   . CHF (congestive heart failure)   . Bipolar disorder   . Obesity   . Atrial fibrillation July 2009    a. very difficult to achieve rate control secondary  to tendency to severe hypotension on medication. b. failed direct current cardioversion September 15, 2007. c. started amiodarone September 16, 2007 with spontaneous conversion of sinus rhythm after 72  hour load on September 18, 2007.  . Tobacco abuse   . Chronic anticoagulation   . Mitral regurgitation     moderate to severe; not an ideal candidate for MV surgery  . Dysrhythmia     hx AF    Plan:  Patient like to continue her current psychometric medication.  Her last Depakote level was 76.7 which was done in February.  Her relationship with the mother is much improved.  She has no agitation or anger.  I will continue Risperdal 2 mg at bedtime, Cogentin 0.5 mg twice a day and Depakote 500 mg twice a day.  She has not taken lorazepam and more than 6 months and I will discontinue that.  We will do blood work including hemoglobin A1c, CBC, CMP and valproic acid level.  Discussed medication side effects and benefits.  Recommended to call us back if she has any question or any concern.  Follow-up in 3 months.  Raheem Kolbe T., MD 11/21/2014

## 2015-02-20 ENCOUNTER — Ambulatory Visit (HOSPITAL_COMMUNITY): Payer: Self-pay | Admitting: Psychiatry

## 2015-03-29 ENCOUNTER — Ambulatory Visit (HOSPITAL_COMMUNITY): Payer: Self-pay | Admitting: Psychiatry

## 2015-04-17 ENCOUNTER — Ambulatory Visit (HOSPITAL_COMMUNITY): Payer: Self-pay | Admitting: Psychiatry

## 2015-05-01 ENCOUNTER — Ambulatory Visit (INDEPENDENT_AMBULATORY_CARE_PROVIDER_SITE_OTHER): Payer: Medicaid Other | Admitting: Psychiatry

## 2015-05-01 ENCOUNTER — Telehealth (HOSPITAL_COMMUNITY): Payer: Self-pay | Admitting: Psychiatry

## 2015-05-01 ENCOUNTER — Encounter (HOSPITAL_COMMUNITY): Payer: Self-pay | Admitting: Psychiatry

## 2015-05-01 VITALS — BP 94/65 | HR 112 | Ht 63.0 in | Wt 185.4 lb

## 2015-05-01 DIAGNOSIS — F319 Bipolar disorder, unspecified: Secondary | ICD-10-CM

## 2015-05-01 MED ORDER — BENZTROPINE MESYLATE 0.5 MG PO TABS: 0.5000 mg | ORAL_TABLET | Freq: Two times a day (BID) | ORAL | Status: DC

## 2015-05-01 MED ORDER — RISPERIDONE 2 MG PO TABS: 2.0000 mg | ORAL_TABLET | Freq: Every day | ORAL | Status: DC

## 2015-05-01 MED ORDER — DIVALPROEX SODIUM 500 MG PO DR TAB: 500.0000 mg | DELAYED_RELEASE_TABLET | Freq: Two times a day (BID) | ORAL | Status: DC

## 2015-05-01 NOTE — Patient Instructions (Signed)
Please Fax [place] at [phone] your complete list of medications and last blood work which include CBC, CMP, Depakote level, TSH and HbA1C. This is second reminder.

## 2015-05-01 NOTE — Telephone Encounter (Signed)
Telephone call first with [facility] to follow up on past lab work ordered for patient but never returned.  Agreed to call [facility] to request these under Dr. Redmond School.  Called [facility] and spoke with Lynwood Dawley, representative who agreed to fax labs completed in October 2016 for Dr. Lolly Mustache to review that were done under Adria Dill, PA-C who is supervised by Dr. Redmond School.  Collateral agreed to send all labs that were completed and though the only one not done that was ordered by Dr. Lolly Mustache in the past was a Hemoglobin A1C.  Provided fax number for labs to be sent to Dr. Lolly Mustache this date.

## 2015-05-01 NOTE — Progress Notes (Signed)
[name]  [facility] [id number] Progress Note  Abigail Mahoney [id number] 60 y.o.  05/01/2015 10:44 AM  Chief Complaint: I am doing good.  I had a good Christmas.  I visited my mom.             History of Present Illness: Abigail Mahoney came for her follow-up appointment.  She is taking her medication and denies any side effects.  She had good Christmas and Thanksgiving.  She visited her mother and she had a good time.  We have ordered blood work on her last visit however he never received any results.  She see Abigail Mahoney for her primary care needs who is a visiting physician at the facility.  Patient also scheduled to see her cardiologist and coming months.  She told that she is taking her medication on time and she has no problems sleeping.  She is able to make some friends where she lives.  She denies any paranoia, hallucination, aggressive behavior or any irritability.  She denies any crying spells but remains very emotional at times.  She has tremors and difficulty walking but is chronic in nature.  Patient does not want to change her medication or reduce the dose.  She denies drinking or using any illegal substances.  Her appetite is okay.  Her vitals are stable. Patient is a resident at [facility]. Abigail Mahoney's minor.    Suicidal Ideation: No Plan Formed: No Patient has means to carry out plan: No  Homicidal Ideation: No Plan Formed: No Patient has means to carry out plan: No  Review of Systems  Constitutional: Negative.   Cardiovascular: Negative for chest pain and palpitations.  Gastrointestinal: Negative for nausea.  Skin: Negative for itching and rash.  Neurological: Positive for tremors.   Psychiatric: Agitation: No Hallucination: No Depressed Mood: No Insomnia: No Hypersomnia: No Altered Concentration: Yes Feels Worthless: No Grandiose Ideas: No Belief In Special Powers: No New/Increased Substance Abuse: No Compulsions: No  Neurologic: Headache: No Seizure: No Paresthesias:  No  Medical History:  She is seeing Dr. Bradly Mahoney for hypertension cardiomyopathy and mitral regurgitation.  Her primary care physician is Abigail Mahoney.    No results found for this or any previous visit (from the past 2160 hour(s)).  Outpatient Encounter Prescriptions as of 05/01/2015  Medication Sig  . acetaminophen (TYLENOL) 325 MG tablet Take 650 mg by mouth every 6 (six) hours as needed.  . benztropine (COGENTIN) 0.5 MG tablet Take 1 tablet (0.5 mg total) by mouth 2 (two) times daily.  . cholecalciferol (VITAMIN D) 400 UNITS TABS tablet Take 800 Units by mouth every morning.  . digoxin 62.5 MCG TABS Take 0.0625 mg by mouth daily.  Marland Kitchen diltiazem (CARDIZEM CD) 360 MG 24 hr capsule Take 1 capsule (360 mg total) by mouth every evening.  . divalproex (DEPAKOTE) 500 MG DR tablet Take 1 tablet (500 mg total) by mouth 2 (two) times daily.  . furosemide (LASIX) 40 MG tablet Take 1 tablet (40 mg total) by mouth 2 (two) times daily.  Marland Kitchen lisinopril (PRINIVIL,ZESTRIL) 5 MG tablet Take 5 mg by mouth every morning.  . metoprolol succinate (TOPROL-XL) 100 MG 24 hr tablet Take 100 mg by mouth every morning.   . potassium chloride SA (K-DUR,KLOR-CON) 20 MEQ tablet Take 20 mEq by mouth daily.  . risperiDONE (RISPERDAL) 2 MG tablet Take 1 tablet (2 mg total) by mouth at bedtime.  Marland Kitchen warfarin (COUMADIN) 4 MG tablet Take 4 mg by mouth daily.   . [DISCONTINUED]  benztropine (COGENTIN) 0.5 MG tablet Take 1 tablet (0.5 mg total) by mouth 2 (two) times daily.  . [DISCONTINUED] divalproex (DEPAKOTE) 500 MG DR tablet Take 1 tablet (500 mg total) by mouth 2 (two) times daily.  . [DISCONTINUED] risperiDONE (RISPERDAL) 2 MG tablet Take 1 tablet (2 mg total) by mouth at bedtime.   No facility-administered encounter medications on file as of 05/01/2015.    Past Psychiatric History/Hospitalization(s): Anxiety: Yes Bipolar Disorder: Yes Depression: Yes Mania: Yes Psychosis: Yes Schizophrenia: No Personality  Disorder: No Hospitalization for psychiatric illness: Yes History of Electroconvulsive Shock Therapy: No Prior Suicide Attempts: No  Physical Exam: Constitutional:  BP 94/65 mmHg  Pulse 112  Ht 5\' 3"  (1.6 m)  Wt 185 lb 6.4 oz (84.097 kg)  BMI 32.85 kg/m2  General Appearance: well nourished  Musculoskeletal: Strength & Muscle Tone: decreased Gait & Station: unsteady Patient leans: Front, needs cain to help walking.  Mental status examination: Patient is fairly groomed and dressed.  She is poorly groomed and maintained fair eye contact.  She is walking with unsteady gait.  She requires stick to help walking.  Her speech is rambling , fast and incoherent at times.  Her thought process is circumstantial and had flight of ideas and loose association.  Her attention and concentration is distracted.  She described her mood emotional and her affect is labile.  She denies any hallucination or any paranoia. Her psychomotor activity is increased.  She denies any active or passive suicidal thoughts or homicidal thoughts.  Her fund of knowledge is below average.  She has no tremors or shakes.  She is alert and oriented x3.  Her insight judgment and impulse control is fair.  Established Problem, Stable/Improving (1), Review of Psycho-Social Stressors (1), Review or order clinical lab tests (1), Review of Last Therapy Session (1) and Review of Medication Regimen & Side Effects (2)  Assessment: Axis I: Bipolar disorder  Axis II: Deferred  Axis III:  Past Medical History  Diagnosis Date  . Cardiomyopathy     CHF-EF 25 to 30%/global hypokinesis with no known CAD  . Hypertension   . CHF (congestive heart failure) (HCC)   . Bipolar disorder (HCC)   . Obesity   . Atrial fibrillation [facility]) July 2009    a. very difficult to achieve rate control secondary  to tendency to severe hypotension on medication. b. failed direct current cardioversion September 15, 2007. c. started amiodarone September 16, 2007 with  spontaneous conversion of sinus rhythm after 72 hour load on September 18, 2007.  . Tobacco abuse   . Chronic anticoagulation   . Mitral regurgitation     moderate to severe; not an ideal candidate for MV surgery  . Dysrhythmia     hx AF    Plan:  I reinforced to have her blood work results faxed to [place].  I provided written instruction after visit summary to have her blood work faxed to [place] for continuation of medication.  We will also call [facility] residential facility .  Discussed medication side effects and benefits.  Patient has no issues or side effects from the medication.  She has tremors shakes but they are chronic and she does not want to change the medication.  Her anger and paranoia is well controlled.  I will continue Risperdal 2 mg at bedtime, Cogentin 0.5 mg twice a day and Depakote 500 mg twice a day.  Discussed medication side effects and benefits.  Recommended to call us back if she  has any question or any concern.  Follow-up in 3 months.  Ajani Rineer T., MD 05/01/2015

## 2015-07-30 ENCOUNTER — Ambulatory Visit (HOSPITAL_COMMUNITY): Payer: Self-pay | Admitting: Psychiatry

## 2015-07-31 ENCOUNTER — Encounter (HOSPITAL_COMMUNITY): Payer: Self-pay | Admitting: Psychiatry

## 2015-07-31 ENCOUNTER — Ambulatory Visit (INDEPENDENT_AMBULATORY_CARE_PROVIDER_SITE_OTHER): Payer: Medicaid Other | Admitting: Psychiatry

## 2015-07-31 VITALS — BP 102/78 | HR 60 | Ht 62.0 in | Wt 192.6 lb

## 2015-07-31 DIAGNOSIS — F319 Bipolar disorder, unspecified: Secondary | ICD-10-CM

## 2015-07-31 MED ORDER — DIVALPROEX SODIUM 500 MG PO DR TAB: 500.0000 mg | DELAYED_RELEASE_TABLET | Freq: Two times a day (BID) | ORAL | Status: DC

## 2015-07-31 MED ORDER — BENZTROPINE MESYLATE 0.5 MG PO TABS: 0.5000 mg | ORAL_TABLET | Freq: Two times a day (BID) | ORAL | Status: DC

## 2015-07-31 MED ORDER — RISPERIDONE 1 MG PO TABS: 1.0000 mg | ORAL_TABLET | Freq: Every day | ORAL | Status: DC

## 2015-07-31 MED ORDER — RISPERIDONE 2 MG PO TABS: 2.0000 mg | ORAL_TABLET | Freq: Every day | ORAL | Status: DC

## 2015-07-31 NOTE — Progress Notes (Signed)
Abigail Mahoney  [facility] [id number] Progress Note  Abigail Mahoney [id number] 60 y.o.  07/31/2015 10:45 AM  Chief Complaint: I am not working as good.  I have a lot of tremors.               History of Present Illness: [name] came for her follow-up appointment.  She is taking her medication as prescribed.  Her walking is slow and her gait is unsteady.  She requires wheelchair .  She has lot of tremors .  She denies any irritability, anger, mood swing.  She sleeping good.  Finally be to see blood work results from her facility which was done in September.  Her Depakote level is 80.4 , WBC count is normal , BUN 17 and cracking 1.05 , her sodium potassium and total protein is normal , her liver enzymes are normal, her cholesterol is 157 and her TSH is 53.7.  Patient continues to visit her mother and she enjoys seeing her.  She has no issues at [facility]. [name] minor.  She denies drinking or using any illegal substances.  She denies any paranoia or any hallucination however her thought processes remains circumstantial.  We talk about reducing Risperdal which may help some of her tremors but her tremors are chronic in nature.  Patient denies any suicidal parts or homicidal thought.  Her energy level is fair.  She does require help in her ADLs due to gait imbalance.  Her vitals are stable.  Suicidal Ideation: No Plan Formed: No Patient has means to carry out plan: No  Homicidal Ideation: No Plan Formed: No Patient has means to carry out plan: No  Review of Systems  Constitutional: Negative.   Cardiovascular: Negative for chest pain and palpitations.  Gastrointestinal: Negative for nausea.  Skin: Negative for itching and rash.  Neurological: Positive for tremors.  Psychiatric/Behavioral: Negative.    Psychiatric: Agitation: No Hallucination: No Depressed Mood: No Insomnia: No Hypersomnia: No Altered Concentration: Yes Feels Worthless: No Grandiose Ideas: No Belief In Special Powers:  No New/Increased Substance Abuse: No Compulsions: No  Neurologic: Headache: No Seizure: No Paresthesias: No  Medical History:  She is seeing Dr. Bradly Chris for hypertension cardiomyopathy and mitral regurgitation.  Her primary care physician is [name].    No results found for this or any previous visit (from the past 2160 hour(s)).  Outpatient Encounter Prescriptions as of 07/31/2015  Medication Sig  . acetaminophen (TYLENOL) 325 MG tablet Take 650 mg by mouth every 6 (six) hours as needed.  . benztropine (COGENTIN) 0.5 MG tablet Take 1 tablet (0.5 mg total) by mouth 2 (two) times daily.  . cholecalciferol (VITAMIN D) 400 UNITS TABS tablet Take 800 Units by mouth every morning.  . digoxin 62.5 MCG TABS Take 0.0625 mg by mouth daily.  Marland Kitchen diltiazem (CARDIZEM CD) 360 MG 24 hr capsule Take 1 capsule (360 mg total) by mouth every evening.  . divalproex (DEPAKOTE) 500 MG DR tablet Take 1 tablet (500 mg total) by mouth 2 (two) times daily.  . furosemide (LASIX) 40 MG tablet Take 1 tablet (40 mg total) by mouth 2 (two) times daily.  Marland Kitchen lisinopril (PRINIVIL,ZESTRIL) 5 MG tablet Take 5 mg by mouth every morning.  . metoprolol succinate (TOPROL-XL) 100 MG 24 hr tablet Take 100 mg by mouth every morning.   . potassium chloride SA (K-DUR,KLOR-CON) 20 MEQ tablet Take 20 mEq by mouth daily.  . risperiDONE (RISPERDAL) 2 MG tablet Take 1 tablet (2 mg total) by  mouth at bedtime.  Marland Kitchen warfarin (COUMADIN) 4 MG tablet Take 4 mg by mouth daily.   . [DISCONTINUED] benztropine (COGENTIN) 0.5 MG tablet Take 1 tablet (0.5 mg total) by mouth 2 (two) times daily.  . [DISCONTINUED] divalproex (DEPAKOTE) 500 MG DR tablet Take 1 tablet (500 mg total) by mouth 2 (two) times daily.  . [DISCONTINUED] risperiDONE (RISPERDAL) 2 MG tablet Take 1 tablet (2 mg total) by mouth at bedtime.   No facility-administered encounter medications on file as of 07/31/2015.   Blood work on September 2016 shows WBC count 7.4,  hemoglobin 13.2, hematocrit 37.2, AST 11, ALT 8, BUN 17, creatinine 1.05, potassium 4.6, sodium 140, cholesterol 157, TSH 1.63, vitamin D 53.7, and Depakote level 80.4   Past Psychiatric History/Hospitalization(s): Anxiety: Yes Bipolar Disorder: Yes Depression: Yes Mania: Yes Psychosis: Yes Schizophrenia: No Personality Disorder: No Hospitalization for psychiatric illness: Yes History of Electroconvulsive Shock Therapy: No Prior Suicide Attempts: No  Physical Exam: Constitutional:  BP 102/78 mmHg  Pulse 60  Ht  (1.575 m)  Wt 192 lb 9.6 oz (87.363 kg)  BMI 35.22 kg/m2  General Appearance: well nourished  Musculoskeletal: Strength & Muscle Tone: decreased Gait & Station: unsteady Patient leans: Front, needs cain to help walking.  Mental status examination: Patient is fairly groomed and dressed.  She is poorly groomed and maintained fair eye contact.  She had unsteady gait and requires wheelchair .  Her speech is rambling , fast and incoherent at times.  Her thought process is circumstantial and had flight of ideas and loose association.  Her attention and concentration is distracted.  She described her mood emotional and her affect is labile.  She denies any hallucination or any paranoia. Her psychomotor activity is increased.  She denies any active or passive suicidal thoughts or homicidal thoughts.  Her fund of knowledge is below average.  She has no tremors or shakes.  She is alert and oriented x3.  Her insight judgment and impulse control is fair.  Established Problem, Stable/Improving (1), New problem, with additional work up planned, Review of Psycho-Social Stressors (1), Review or order clinical lab tests (1), Review of Last Therapy Session (1), Review of Medication Regimen & Side Effects (2) and Review of New Medication or Change in Dosage (2)  Assessment: Axis I: Bipolar disorder  Axis II: Deferred  Axis III:  Past Medical History  Diagnosis Date  .  Cardiomyopathy     CHF-EF 25 to 30%/global hypokinesis with no known CAD  . Hypertension   . CHF (congestive heart failure) (HCC)   . Bipolar disorder (HCC)   . Obesity   . Atrial fibrillation [facility]) July 2009    a. very difficult to achieve rate control secondary  to tendency to severe hypotension on medication. b. failed direct current cardioversion September 15, 2007. c. started amiodarone September 16, 2007 with spontaneous conversion of sinus rhythm after 72 hour load on September 18, 2007.  . Tobacco abuse   . Chronic anticoagulation   . Mitral regurgitation     moderate to severe; not an ideal candidate for MV surgery  . Dysrhythmia     hx AF    Plan:  I reviewed her medication, blood work results and side effects.  She has tremors which are and chronic but I'm going to reduce her Risperdal to see if it helps.  Her psychosis and manic symptoms are stable .  However I recommended if reducing the Risperdal causes worsening of mania and psychosis then  she should call us immediately.  She will start Risperdal 1 mg at bedtime, she will continue Cogentin 0.5 mg twice a day and Depakote 500 mg twice a day.  Her Depakote level is within therapeutic range.  Discussed risk of relapse with reducing Risperdal and in that case she will call us immediately.  Discuss safety plan that anytime having active suicidal thoughts or homicidal thoughts then she need to call 911 or go to the local emergency room.  Discussed medication side effects and benefits.  Recommended to call us back if she has any question or any concern.  Follow-up in 3 months.  Artist Bloom T., MD 07/31/2015

## 2015-11-06 ENCOUNTER — Ambulatory Visit (HOSPITAL_COMMUNITY): Payer: Self-pay | Admitting: Psychiatry

## 2015-12-25 ENCOUNTER — Ambulatory Visit (HOSPITAL_COMMUNITY): Payer: Self-pay | Admitting: Psychiatry

## 2016-01-23 ENCOUNTER — Ambulatory Visit (INDEPENDENT_AMBULATORY_CARE_PROVIDER_SITE_OTHER): Payer: Medicaid Other | Admitting: Psychiatry

## 2016-01-23 ENCOUNTER — Encounter (HOSPITAL_COMMUNITY): Payer: Self-pay | Admitting: Psychiatry

## 2016-01-23 ENCOUNTER — Ambulatory Visit (HOSPITAL_COMMUNITY): Payer: Self-pay | Admitting: Psychiatry

## 2016-01-23 VITALS — BP 110/68 | HR 59 | Ht 62.0 in | Wt 190.0 lb

## 2016-01-23 DIAGNOSIS — Z79899 Other long term (current) drug therapy: Secondary | ICD-10-CM | POA: Diagnosis not present

## 2016-01-23 DIAGNOSIS — F319 Bipolar disorder, unspecified: Secondary | ICD-10-CM | POA: Diagnosis not present

## 2016-01-23 MED ORDER — RISPERIDONE 1 MG PO TABS
1.0000 mg | ORAL_TABLET | Freq: Every day | ORAL | 0 refills | Status: AC
Start: 2016-01-23 — End: ?

## 2016-01-23 MED ORDER — BENZTROPINE MESYLATE 0.5 MG PO TABS: 0.5000 mg | ORAL_TABLET | Freq: Two times a day (BID) | ORAL | 0 refills | Status: AC

## 2016-01-23 MED ORDER — DIVALPROEX SODIUM 500 MG PO DR TAB: 500.0000 mg | DELAYED_RELEASE_TABLET | Freq: Two times a day (BID) | ORAL | 0 refills | Status: AC

## 2016-01-23 NOTE — Progress Notes (Signed)
Abigail Mahoney  [facility] [id number] Progress Note  Abigail Mahoney [id number] 60 y.o.  01/23/2016 11:21 AM  Chief Complaint: My tremors are getting better.  I am sleeping good.                 History of Present Illness: Abigail HatchetSheila Mahoney for her follow-up appointment.  On her last visit.  Use Risperdal because she was concerned about tremors.  She like to reduce dose and her tremors are much better from the past.  She sleeping good.  She still have a lot of mood lability and gets easily emotional with tearfulness but denies any psychosis, paranoia or any hallucination.  She likes her present place where she is living.  [facility]  She is not aggressive or having any violence episode.  Patient denies drinking alcohol or using any illegal substances.  She like to keep her current psychiatric medication which is Depakote 500 mg twice a day, Risperdal 1 mg at bedtime and Cogentin 0.5 mg twice a day.  She has difficulty walking and requires wheelchair to prevent fall.  She does require help her to do her ADLs due to gait imbalance.  Her appetite is good.  Her vital signs are stable.  She is hoping to have a Thanksgiving meal with her mother who 59 year old.  Suicidal Ideation: No Plan Formed: No Patient has means to carry out plan: No  Homicidal Ideation: No Plan Formed: No Patient has means to carry out plan: No  Review of Systems  Constitutional: Negative.   HENT: Negative.   Eyes: Negative.   Respiratory: Negative.   Cardiovascular: Negative.  Negative for chest pain and palpitations.  Gastrointestinal: Negative for nausea.  Musculoskeletal: Positive for back pain.  Skin: Negative.   Neurological: Positive for tremors.   Psychiatric: Agitation: No Hallucination: No Depressed Mood: No Insomnia: No Hypersomnia: No Altered Concentration: Yes Feels Worthless: No Grandiose Ideas: No Belief In Special Powers: No New/Increased Substance Abuse: No Compulsions:  No  Neurologic: Headache: No Seizure: No Paresthesias: No  Medical History:  She is seeing Abigail Mahoney for hypertension cardiomyopathy and mitral regurgitation.  Her primary care physician is Abigail Mahoney.    No results found for this or any previous visit (from the past 2160 hour(s)).  Outpatient Encounter Prescriptions as of 01/23/2016  Medication Sig  . acetaminophen (TYLENOL) 325 MG tablet Take 650 mg by mouth every 6 (six) hours as needed.  . benztropine (COGENTIN) 0.5 MG tablet Take 1 tablet (0.5 mg total) by mouth 2 (two) times daily.  . cholecalciferol (VITAMIN D) 400 UNITS TABS tablet Take 800 Units by mouth every morning.  . digoxin 62.5 MCG TABS Take 0.0625 mg by mouth daily.  Marland Kitchen. diltiazem (CARDIZEM CD) 360 MG 24 hr capsule Take 1 capsule (360 mg total) by mouth every evening.  . divalproex (DEPAKOTE) 500 MG DR tablet Take 1 tablet (500 mg total) by mouth 2 (two) times daily.  . furosemide (LASIX) 40 MG tablet Take 1 tablet (40 mg total) by mouth 2 (two) times daily.  Marland Kitchen. lisinopril (PRINIVIL,ZESTRIL) 5 MG tablet Take 5 mg by mouth every morning.  . metoprolol succinate (TOPROL-XL) 100 MG 24 hr tablet Take 100 mg by mouth every morning.   . potassium chloride SA (K-DUR,KLOR-CON) 20 MEQ tablet Take 20 mEq by mouth daily.  . risperiDONE (RISPERDAL) 1 MG tablet Take 1 tablet (1 mg total) by mouth at bedtime.  Marland Kitchen. warfarin (COUMADIN) 4 MG tablet  Take 4 mg by mouth daily.   . [DISCONTINUED] benztropine (COGENTIN) 0.5 MG tablet Take 1 tablet (0.5 mg total) by mouth 2 (two) times daily.  . [DISCONTINUED] divalproex (DEPAKOTE) 500 MG DR tablet Take 1 tablet (500 mg total) by mouth 2 (two) times daily.  . [DISCONTINUED] risperiDONE (RISPERDAL) 1 MG tablet Take 1 tablet (1 mg total) by mouth at bedtime.   No facility-administered encounter medications on file as of 01/23/2016.    Blood work on September 2016 shows WBC count 7.4, hemoglobin 13.2, hematocrit 37.2, AST 11, ALT 8, BUN  17, creatinine 1.05, potassium 4.6, sodium 140, cholesterol 157, TSH 1.63, vitamin D 53.7, and Depakote level 80.4   Past Psychiatric History/Hospitalization(s): Anxiety: Yes Bipolar Disorder: Yes Depression: Yes Mania: Yes Psychosis: Yes Schizophrenia: No Personality Disorder: No Hospitalization for psychiatric illness: Yes History of Electroconvulsive Shock Therapy: No Prior Suicide Attempts: No  Physical Exam: Constitutional:  BP 110/68   Pulse (!) 59   Ht 5\' 2"  (1.575 m)   Wt 190 lb (86.2 kg)   BMI 34.75 kg/m   General Appearance: alert, oriented, no acute distress and well nourished  Musculoskeletal: Strength & Muscle Tone: decreased Gait & Station: unsteady Patient leans: Right, Left, Front and Backward, she needs wheelchair to prevent fall.    Mental status examination: Patient is casually dressed and fairly groomed.  She does not appear to be in distress.  She described her mood emotional and her affect is labile.  She gets easily tearful when she talks about her past.  However she denies any suicidal thoughts or homicidal thought.  She denies any auditory or visual hallucination.  There were no delusions, paranoia or any obsessive thoughts.  Her thought processes remains circumstantial and she has difficulty concentrating her thoughts.  Her attention concentration is distracted.  Her psychomotor activity is increased.  She is alert and oriented 3.  Her fund of knowledge is low average.  She has mild tremors in her both hand.  Her gait is unsteady and she requires wheelchair to prevent fall.  Her insight judgment and impulse control is fair.  Established Problem, Stable/Improving (1), Review or order clinical lab tests (1), Review of Last Therapy Session (1) and Review of Medication Regimen & Side Effects (2)  Assessment: Axis I: Bipolar disorder  Axis II: Deferred  Axis III:  Past Medical History:  Diagnosis Date  . Atrial fibrillation [facility]) July 2009   a. very  difficult to achieve rate control secondary  to tendency to severe hypotension on medication. b. failed direct current cardioversion September 15, 2007. c. started amiodarone September 16, 2007 with spontaneous conversion of sinus rhythm after 72 hour load on September 18, 2007.  . Bipolar disorder (HCC)   . Cardiomyopathy    CHF-EF 25 to 30%/global hypokinesis with no known CAD  . CHF (congestive heart failure) (HCC)   . Chronic anticoagulation   . Dysrhythmia    hx AF  . Hypertension   . Mitral regurgitation    moderate to severe; not an ideal candidate for MV surgery  . Obesity   . Tobacco abuse     Plan:  Patient is stable on her current psychiatric medication area since we cut down the Risperdal dose her tremors are improved.  She like to continue her current psychiatric medication.  She is not aggressive or impulsive.  I recommended to have her blood work since the last blood work was drawn in September 2016.  At that time her Depakote  level was 80.  Discussed medication side effects and benefits.  Recommended to call us back if she has any question, concern if he feel worsening of the symptom.  Follow-up in 3 months    Malacki Mcphearson T., MD 01/23/2016

## 2016-03-23 ENCOUNTER — Emergency Department (HOSPITAL_COMMUNITY)
Admission: EM | Admit: 2016-03-23 | Discharge: 2016-03-23 | Disposition: A | Discharge status: Home / Self Care | Payer: Medicaid Other | Attending: Emergency Medicine | Admitting: Emergency Medicine

## 2016-03-23 DIAGNOSIS — F17200 Nicotine dependence, unspecified, uncomplicated: Secondary | ICD-10-CM | POA: Insufficient documentation

## 2016-03-23 DIAGNOSIS — I110 Hypertensive heart disease with heart failure: Secondary | ICD-10-CM | POA: Diagnosis not present

## 2016-03-23 DIAGNOSIS — I5022 Chronic systolic (congestive) heart failure: Secondary | ICD-10-CM | POA: Diagnosis not present

## 2016-03-23 DIAGNOSIS — B86 Scabies: Secondary | ICD-10-CM

## 2016-03-23 DIAGNOSIS — Z7901 Long term (current) use of anticoagulants: Secondary | ICD-10-CM | POA: Diagnosis not present

## 2016-03-23 DIAGNOSIS — R21 Rash and other nonspecific skin eruption: Secondary | ICD-10-CM | POA: Diagnosis present

## 2016-03-23 MED ORDER — PERMETHRIN 5 % EX CREA: TOPICAL_CREAM | CUTANEOUS | 1 refills | Status: AC

## 2016-03-23 NOTE — ED Notes (Signed)
Patient was alert, oriented and stable upon discharge. RN went over AVS and patient had no further questions.  

## 2016-03-23 NOTE — ED Provider Notes (Signed)
[facility]-EMERGENCY DEPT Provider Note   CSN: [id number] Arrival date & time: 03/23/16  1700   By signing my name below, I, Soijett Blue, attest that this documentation has been prepared under the direction and in the presence of Langston Masker, PA-C Electronically Signed: Soijett Blue, ED Scribe. 03/23/16. 5:45 PM.  History   Chief Complaint Chief Complaint  Patient presents with  . Rash    HPI Abigail Mahoney is a 61 y.o. female with a PMHx of HTN, who presents to the Emergency Department brought in by EMS from [facility] complaining of pruritic rash onset 1 week ago. Pt notes that her rash is localized to her right hand, right forearm, and BLE. Pt is having associated symptoms of redness to the affected areas. She has not tried any medications for the relief of her symptoms. She denies wound, fever, chills, and any other symptoms.    The history is provided by the patient. No language interpreter was used.    Past Medical History:  Diagnosis Date  . Atrial fibrillation [facility]) July 2009   a. very difficult to achieve rate control secondary  to tendency to severe hypotension on medication. b. failed direct current cardioversion September 15, 2007. c. started amiodarone September 16, 2007 with spontaneous conversion of sinus rhythm after 72 hour load on September 18, 2007.  . Bipolar disorder (HCC)   . Cardiomyopathy    CHF-EF 25 to 30%/global hypokinesis with no known CAD  . CHF (congestive heart failure) (HCC)   . Chronic anticoagulation   . Dysrhythmia    hx AF  . Hypertension   . Mitral regurgitation    moderate to severe; not an ideal candidate for MV surgery  . Obesity   . Tobacco abuse     Patient Active Problem List   Diagnosis Date Noted  . Chronic systolic heart failure (HCC) 08/18/2013  . Atrial fibrillation (HCC) 08/18/2013  . MR (mitral regurgitation) 02/11/2011  . Abnormal involuntary movement 10/30/2009  . OBESITY 10/03/2008  . Bipolar disorder (HCC) 10/03/2008  . ANXIETY  10/03/2008  . Congestive dilated cardiomyopathy (HCC) 10/03/2008  . TOBACCO ABUSE 01/14/2008  . Essential hypertension 01/14/2008  . SHORTNESS OF BREATH 01/14/2008    Past Surgical History:  Procedure Laterality Date  . MULTIPLE EXTRACTIONS WITH ALVEOLOPLASTY N/A 08/02/2013   Procedure: MULTIPLE EXTRACTIONS WITH ALVEOLOPLASTY;  Surgeon: Georgia Lopes, DDS;  Location: [facility] OR;  Service: Oral Surgery;  Laterality: N/A;  . TRANSESOPHAGEAL ECHOCARDIOGRAM  2011   The EF is 25 to 30% with global hypokinesis, mild mitral regurgitation. No left atrial appendage clot.     OB History    No data available       Home Medications    Prior to Admission medications   Medication Sig Start Date End Date Taking? Authorizing Provider  acetaminophen (TYLENOL) 325 MG tablet Take 650 mg by mouth every 6 (six) hours as needed.    Historical Provider, MD  benztropine (COGENTIN) 0.5 MG tablet Take 1 tablet (0.5 mg total) by mouth 2 (two) times daily. 01/23/16   Cleotis Nipper, MD  cholecalciferol (VITAMIN D) 400 UNITS TABS tablet Take 800 Units by mouth every morning.    Historical Provider, MD  digoxin 62.5 MCG TABS Take 0.0625 mg by mouth daily. 08/25/13   Beatrice Lecher, PA-C  diltiazem (CARDIZEM CD) 360 MG 24 hr capsule Take 1 capsule (360 mg total) by mouth every evening. 08/24/13   Beatrice Lecher, PA-C  divalproex (DEPAKOTE) 500  MG DR tablet Take 1 tablet (500 mg total) by mouth 2 (two) times daily. 01/23/16   Cleotis Nipper, MD  furosemide (LASIX) 40 MG tablet Take 1 tablet (40 mg total) by mouth 2 (two) times daily. 12/12/11   Wendall Stade, MD  lisinopril (PRINIVIL,ZESTRIL) 5 MG tablet Take 5 mg by mouth every morning.    Historical Provider, MD  metoprolol succinate (TOPROL-XL) 100 MG 24 hr tablet Take 100 mg by mouth every morning.  07/14/11   Wendall Stade, MD  potassium chloride SA (K-DUR,KLOR-CON) 20 MEQ tablet Take 20 mEq by mouth daily.    Historical Provider, MD  risperiDONE (RISPERDAL) 1 MG  tablet Take 1 tablet (1 mg total) by mouth at bedtime. 01/23/16   Cleotis Nipper, MD  warfarin (COUMADIN) 4 MG tablet Take 4 mg by mouth daily.     Historical Provider, MD    Family History Family History  Problem Relation Age of Onset  . Hypertension Other   . Diabetes Other     Social History Social History  Substance Use Topics  . Smoking status: Current Some Day Smoker    Packs/day: 0.50    Years: 15.00  . Smokeless tobacco: Never Used  . Alcohol use No     Allergies   Chocolate and Lac bovis   Review of Systems Review of Systems  Constitutional: Negative for chills and fever.  Skin: Positive for color change (redness to affected areas) and rash (right hand, right forearm, and BLE). Negative for wound.     Physical Exam Updated Vital Signs BP (!) 152/122 (BP Location: Left Arm)   Pulse 103   Temp 98.6 F (37 C) (Oral)   Resp 16   SpO2 94%   Physical Exam  Constitutional: She is oriented to person, place, and time. She appears well-developed and well-nourished. No distress.  HENT:  Head: Normocephalic and atraumatic.  Eyes: EOM are normal.  Neck: Neck supple.  Cardiovascular: Normal rate.   Pulmonary/Chest: Effort normal. No respiratory distress.  Abdominal: She exhibits no distension.  Musculoskeletal: Normal range of motion.  Neurological: She is alert and oriented to person, place, and time.  Skin: Skin is warm and dry.  Multiple burrows to right arm and bilateral lower legs.   Psychiatric: She has a normal mood and affect. Her behavior is normal.  Nursing note and vitals reviewed.    ED Treatments / Results  DIAGNOSTIC STUDIES: Oxygen Saturation is 94% on RA, adequate by my interpretation.    COORDINATION OF CARE: 5:41 PM Discussed treatment plan with pt at bedside which includes permethrin cream Rx and pt agreed to plan.  Procedures Procedures (including critical care time)  Medications Ordered in ED Medications - No data to  display   Initial Impression / Assessment and Plan / ED Course  I have reviewed the triage vital signs and the nursing notes.   Clinical Course       Final Clinical Impressions(s) / ED Diagnoses   Final diagnoses:  Scabies    New Prescriptions New Prescriptions   No medications on file   Meds ordered this encounter  Medications  . permethrin (ELIMITE) 5 % cream    Sig: Apply to affected area once.  Retreat in 1 week if still symptomatic    Dispense:  60 g    Refill:  1    Order Specific Question:   Supervising Provider    Answer:   Eber Hong [[id number]]    I personally  performed the services in this documentation, which was scribed in my presence.  The recorded information has been reviewed and considered.   Barnet Pall.    Lonia Skinner [employer], PA-C 03/23/16 1919    Raeford Razor, MD 03/31/16 1435

## 2016-03-23 NOTE — ED Triage Notes (Signed)
Pt presents from [facility] after having a rash on her R hand and arm. Pruitic. Alert and oriented per norm. Developmentally delayed.

## 2016-04-23 ENCOUNTER — Ambulatory Visit (HOSPITAL_COMMUNITY): Payer: Self-pay | Admitting: Psychiatry

## 2016-10-09 ENCOUNTER — Other Ambulatory Visit: Payer: Self-pay | Admitting: Internal Medicine

## 2016-10-09 DIAGNOSIS — Z1231 Encounter for screening mammogram for malignant neoplasm of breast: Secondary | ICD-10-CM

## 2016-10-22 ENCOUNTER — Ambulatory Visit: Payer: Self-pay

## 2017-06-08 DEATH — deceased
